# Patient Record
Sex: Male | Born: 1960 | Race: White | Hispanic: No | Marital: Single | Smoking: Never smoker
Health system: Southern US, Community
[De-identification: ages and names within clinical notes are randomized; demographics above are authoritative.]

## PROBLEM LIST (undated history)

## (undated) DIAGNOSIS — E119 Type 2 diabetes mellitus without complications: Secondary | ICD-10-CM

---

## 2001-05-26 ENCOUNTER — Ambulatory Visit (HOSPITAL_BASED_OUTPATIENT_CLINIC_OR_DEPARTMENT_OTHER): Admission: RE | Admit: 2001-05-26 | Discharge: 2001-05-26 | Payer: Self-pay | Admitting: Orthopedic Surgery

## 2001-08-27 ENCOUNTER — Ambulatory Visit (HOSPITAL_BASED_OUTPATIENT_CLINIC_OR_DEPARTMENT_OTHER): Admission: RE | Admit: 2001-08-27 | Discharge: 2001-08-27 | Payer: Self-pay | Admitting: Orthopedic Surgery

## 2002-02-23 ENCOUNTER — Ambulatory Visit (HOSPITAL_BASED_OUTPATIENT_CLINIC_OR_DEPARTMENT_OTHER): Admission: RE | Admit: 2002-02-23 | Discharge: 2002-02-24 | Payer: Self-pay | Admitting: Orthopedic Surgery

## 2002-09-22 ENCOUNTER — Ambulatory Visit (HOSPITAL_BASED_OUTPATIENT_CLINIC_OR_DEPARTMENT_OTHER): Admission: RE | Admit: 2002-09-22 | Discharge: 2002-09-22 | Payer: Self-pay | Admitting: Orthopedic Surgery

## 2003-03-29 ENCOUNTER — Ambulatory Visit (HOSPITAL_COMMUNITY): Admission: RE | Admit: 2003-03-29 | Discharge: 2003-03-29 | Payer: Self-pay | Admitting: Orthopedic Surgery

## 2003-03-29 ENCOUNTER — Ambulatory Visit (HOSPITAL_BASED_OUTPATIENT_CLINIC_OR_DEPARTMENT_OTHER): Admission: RE | Admit: 2003-03-29 | Discharge: 2003-03-30 | Payer: Self-pay | Admitting: Orthopedic Surgery

## 2013-10-15 ENCOUNTER — Emergency Department (HOSPITAL_COMMUNITY)
Admission: EM | Admit: 2013-10-15 | Discharge: 2013-10-15 | Disposition: A | Discharge status: Home / Self Care | Payer: Self-pay | Attending: Emergency Medicine | Admitting: Emergency Medicine

## 2013-10-15 DIAGNOSIS — T4271XA Poisoning by unspecified antiepileptic and sedative-hypnotic drugs, accidental (unintentional), initial encounter: Secondary | ICD-10-CM | POA: Insufficient documentation

## 2013-10-15 DIAGNOSIS — R4182 Altered mental status, unspecified: Secondary | ICD-10-CM | POA: Insufficient documentation

## 2013-10-15 DIAGNOSIS — Z79899 Other long term (current) drug therapy: Secondary | ICD-10-CM | POA: Insufficient documentation

## 2013-10-15 DIAGNOSIS — Z880 Allergy status to penicillin: Secondary | ICD-10-CM | POA: Insufficient documentation

## 2013-10-15 DIAGNOSIS — F1010 Alcohol abuse, uncomplicated: Secondary | ICD-10-CM | POA: Insufficient documentation

## 2013-10-15 DIAGNOSIS — Y939 Activity, unspecified: Secondary | ICD-10-CM | POA: Insufficient documentation

## 2013-10-15 DIAGNOSIS — T40601A Poisoning by unspecified narcotics, accidental (unintentional), initial encounter: Secondary | ICD-10-CM

## 2013-10-15 DIAGNOSIS — Y929 Unspecified place or not applicable: Secondary | ICD-10-CM | POA: Insufficient documentation

## 2013-10-15 NOTE — ED Notes (Signed)
Patient admits to drinking Vodka and chewing fentanyl patches.
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
## 2013-10-15 NOTE — ED Notes (Signed)
MD, Resident at bedside.

## 2013-10-15 NOTE — Discharge Instructions (Signed)
Overdose, Adult  A person can overdose on alcohol, drugs or both by accident or on purpose. If it was on purpose, it is a serious matter. Professional help should be sought. If the overdose was an accident, certain steps should be taken to make sure that it never happens again.  ACCIDENTAL OVERDOSE  Overdosing on prescription medications can be a result of:  · Not understanding the instructions.  · Misreading the label.  · Forgetting that you took a dose and then taking another by mistake. This situation happens a lot.  To make sure this does not happen again:  · Clarify the correct dosage with your caregiver.  · Place the correct dosage in a "pill-minder" container (labeled for each day and time of day).  · Have someone dispense your medicine.  Please be sure to follow-up with your primary care doctor as directed.  INTENTIONAL OVERDOSE  If the overdose was on purpose, it is a serious situation. Taking more than the prescribed amount of medications (including taking someone else's prescription), abusing street drugs or drinking an amount of alcohol that requires medical treatment can show a variety of possible problems. These may indicate you:  · Are depressed or suicidal.  · Are abusing drugs, took too much or combined different drugs to experiment with the effects.  · Mixed alcohol with drugs and did not realize the danger of doing so (this is drug abuse).  · Are suffering addiction to drugs and/or alcohol (also known as chemical dependency).  · Binge drink.  If you have not been referred to a mental health professional for help, it is important that you get help right away. Only a professional can determine which problems may exist and what the best course of treatment may be. It is your responsibility to follow-up with further evaluation or treatment as directed.   Alcohol is responsible for a large number of overdoses and unintended deaths among college-age young adults. Binge drinking is consuming 4-5 drinks  in a short period of time. The amount of alcohol in standard servings of wine (5 oz.), beer (12 oz.) and distilled spirits (1.5 oz., 80 proof) is the same. Beer or wine can be just as dangerous to the binge drinker as "hard" liquor can be.   CONSEQUENCES OF BINGE DRINKING  Alcohol poisoning is the most serious consequence of binge drinking. This is a severe and potentially fatal physical reaction to an alcohol overdose. When too much alcohol is consumed, the brain does not get enough oxygen. The lack of oxygen will eventually cause the brain to shut down the voluntary functions that regulate breathing and heart rate. Symptoms of alcohol poisoning include:  · Vomiting.  · Passing out (unconsciousness).  · Cold, clammy, pale or bluish skin.  · Slow or irregular breathing.  WHAT SHOULD I DO NEXT?  If you have a history of drug abuse or suffer chemical dependency (alcoholism, drug addiction or both), you might consider the following:  · Talk with a qualified substance abuse counselor and consider entering a treatment program.  · Go to a detox facility if necessary.  · If you were attending self-help group meetings, consider returning to them and go often.  · Explore other resources located near you (see sources listed below).  If you are unsure if you have a substance abuse problem, ask yourself the following questions:  · Have you been told by friends or family that drugs/alcohol has become a problem?  · Do you get into fights   when drinking or using drugs?  · Do you have blackouts (not remembering what you do while using)?  · Do you lie about use or amounts of drugs or alcohol you consume?  · Do you need chemicals to get you going?  · Do you suffer in work or school performance because of drug or alcohol use?  · Do you get sick from drug or alcohol use but continue to use anyway?  · Do you need drugs or alcohol to relate to people or feel comfortable in social situations?  · Do you use drugs or alcohol to forget  problems?  If you answered "Yes" to any of the above questions, it means you show signs of chemical dependency and a professional evaluation is suggested. The longer the use of drugs and alcohol continues, the problems will become greater.  SEEK IMMEDIATE MEDICAL CARE IF:   · You feel like you might repeat your problematic behavior.  · You need someone to talk to and feel that it should not wait.  · You feel you are a danger to yourself or someone else.  · You feel like you are having a new reaction to medications you are taking, or you are getting worse after leaving a care center.  · You have an overwhelming urge to drink or use drugs.  Addiction cannot be cured, but it can be treated successfully. Treatment centers are listed in the yellow pages under: Cocaine, Narcotics, and Alcoholics Anonymous. Most hospitals and clinics can refer you to a specialized care center. The [place] government maintains a toll-free number for obtaining treatment referrals: 1-800-662-4357 or 1-800-487-4889 (TDD) and maintains a website: http://findtreatment.samhsa.gov. Other websites for additional information are: www.mentalhealth.samhsa.gov. and www.[place]ida.gov.  In [place] treatment resources are listed in each Province with listings available under [facility] [employer] [facility] or similar titles.  Document Released: 06/06/2003 Document Revised: 08/26/2011 Document Reviewed: 04/27/2008  [employer]® Patient Information ©2014 [employer].

## 2013-10-15 NOTE — ED Notes (Signed)
Family at bedside. 

## 2013-10-15 NOTE — ED Notes (Signed)
Valuables were given back to pt

## 2013-10-15 NOTE — ED Notes (Signed)
Received from EMS $100 dollar bills x 3, $20 x2, 25 cents x 2 and 10 cents x2. Patients pants, shoes, keys, wallet.

## 2013-10-15 NOTE — ED Notes (Signed)
Patient given paper scrubs to go home in.

## 2013-10-16 NOTE — ED Provider Notes (Signed)
.  Face to face Exam:  General: Lethargic with slurring fo words HEENT:  Atraumatic Resp:  Normal effort Abd:  Nondistended Neuro:No focal deficits    Roberto Shiobert L Doak Mah, MD 10/16/13 1056

## 2020-03-15 ENCOUNTER — Inpatient Hospital Stay (HOSPITAL_COMMUNITY)
Admission: EM | Admit: 2020-03-15 | Discharge: 2020-04-17 | DRG: 871 | Disposition: E | Payer: Self-pay | Attending: Internal Medicine | Admitting: Internal Medicine

## 2020-03-15 ENCOUNTER — Emergency Department (HOSPITAL_COMMUNITY): Payer: Self-pay

## 2020-03-15 ENCOUNTER — Other Ambulatory Visit: Payer: Self-pay

## 2020-03-15 ENCOUNTER — Encounter (HOSPITAL_COMMUNITY): Payer: Self-pay

## 2020-03-15 DIAGNOSIS — Z9114 Patient's other noncompliance with medication regimen: Secondary | ICD-10-CM

## 2020-03-15 DIAGNOSIS — Z87891 Personal history of nicotine dependence: Secondary | ICD-10-CM

## 2020-03-15 DIAGNOSIS — E871 Hypo-osmolality and hyponatremia: Secondary | ICD-10-CM | POA: Diagnosis not present

## 2020-03-15 DIAGNOSIS — E870 Hyperosmolality and hypernatremia: Secondary | ICD-10-CM

## 2020-03-15 DIAGNOSIS — D509 Iron deficiency anemia, unspecified: Secondary | ICD-10-CM | POA: Diagnosis present

## 2020-03-15 DIAGNOSIS — Z6822 Body mass index (BMI) 22.0-22.9, adult: Secondary | ICD-10-CM

## 2020-03-15 DIAGNOSIS — Z79899 Other long term (current) drug therapy: Secondary | ICD-10-CM

## 2020-03-15 DIAGNOSIS — E1100 Type 2 diabetes mellitus with hyperosmolarity without nonketotic hyperglycemic-hyperosmolar coma (NKHHC): Secondary | ICD-10-CM | POA: Diagnosis present

## 2020-03-15 DIAGNOSIS — N179 Acute kidney failure, unspecified: Secondary | ICD-10-CM | POA: Diagnosis present

## 2020-03-15 DIAGNOSIS — IMO0002 Reserved for concepts with insufficient information to code with codable children: Secondary | ICD-10-CM | POA: Diagnosis present

## 2020-03-15 DIAGNOSIS — E1140 Type 2 diabetes mellitus with diabetic neuropathy, unspecified: Secondary | ICD-10-CM | POA: Diagnosis present

## 2020-03-15 DIAGNOSIS — E1110 Type 2 diabetes mellitus with ketoacidosis without coma: Principal | ICD-10-CM

## 2020-03-15 DIAGNOSIS — E1165 Type 2 diabetes mellitus with hyperglycemia: Secondary | ICD-10-CM | POA: Diagnosis present

## 2020-03-15 DIAGNOSIS — Z66 Do not resuscitate: Secondary | ICD-10-CM | POA: Diagnosis not present

## 2020-03-15 DIAGNOSIS — E440 Moderate protein-calorie malnutrition: Secondary | ICD-10-CM | POA: Diagnosis present

## 2020-03-15 DIAGNOSIS — I10 Essential (primary) hypertension: Secondary | ICD-10-CM | POA: Diagnosis present

## 2020-03-15 DIAGNOSIS — F1910 Other psychoactive substance abuse, uncomplicated: Secondary | ICD-10-CM | POA: Diagnosis present

## 2020-03-15 DIAGNOSIS — E876 Hypokalemia: Secondary | ICD-10-CM | POA: Diagnosis present

## 2020-03-15 DIAGNOSIS — K219 Gastro-esophageal reflux disease without esophagitis: Secondary | ICD-10-CM | POA: Diagnosis present

## 2020-03-15 DIAGNOSIS — J988 Other specified respiratory disorders: Secondary | ICD-10-CM

## 2020-03-15 DIAGNOSIS — E11649 Type 2 diabetes mellitus with hypoglycemia without coma: Secondary | ICD-10-CM | POA: Diagnosis not present

## 2020-03-15 DIAGNOSIS — E872 Acidosis, unspecified: Secondary | ICD-10-CM

## 2020-03-15 DIAGNOSIS — J9601 Acute respiratory failure with hypoxia: Secondary | ICD-10-CM | POA: Diagnosis not present

## 2020-03-15 DIAGNOSIS — Z20822 Contact with and (suspected) exposure to covid-19: Secondary | ICD-10-CM | POA: Diagnosis present

## 2020-03-15 DIAGNOSIS — J90 Pleural effusion, not elsewhere classified: Secondary | ICD-10-CM | POA: Diagnosis present

## 2020-03-15 DIAGNOSIS — A419 Sepsis, unspecified organism: Principal | ICD-10-CM | POA: Diagnosis present

## 2020-03-15 DIAGNOSIS — D539 Nutritional anemia, unspecified: Secondary | ICD-10-CM | POA: Diagnosis present

## 2020-03-15 DIAGNOSIS — R0602 Shortness of breath: Secondary | ICD-10-CM

## 2020-03-15 DIAGNOSIS — J189 Pneumonia, unspecified organism: Secondary | ICD-10-CM

## 2020-03-15 DIAGNOSIS — Z515 Encounter for palliative care: Secondary | ICD-10-CM

## 2020-03-15 DIAGNOSIS — E860 Dehydration: Secondary | ICD-10-CM

## 2020-03-15 DIAGNOSIS — T17908A Unspecified foreign body in respiratory tract, part unspecified causing other injury, initial encounter: Secondary | ICD-10-CM

## 2020-03-15 DIAGNOSIS — K529 Noninfective gastroenteritis and colitis, unspecified: Secondary | ICD-10-CM | POA: Diagnosis present

## 2020-03-15 DIAGNOSIS — F1020 Alcohol dependence, uncomplicated: Secondary | ICD-10-CM | POA: Diagnosis present

## 2020-03-15 DIAGNOSIS — Z7984 Long term (current) use of oral hypoglycemic drugs: Secondary | ICD-10-CM

## 2020-03-15 DIAGNOSIS — R6521 Severe sepsis with septic shock: Secondary | ICD-10-CM | POA: Diagnosis present

## 2020-03-15 DIAGNOSIS — D649 Anemia, unspecified: Secondary | ICD-10-CM

## 2020-03-15 HISTORY — DX: Type 2 diabetes mellitus without complications: E11.9

## 2020-03-15 LAB — RESPIRATORY PANEL BY RT PCR (FLU A&B, COVID)
Influenza A by PCR: NEGATIVE
Influenza B by PCR: NEGATIVE
SARS Coronavirus 2 by RT PCR: NEGATIVE

## 2020-03-15 LAB — CBC WITH DIFFERENTIAL/PLATELET
Abs Immature Granulocytes: 0.23 10*3/uL — ABNORMAL HIGH (ref 0.00–0.07)
Basophils Absolute: 0.1 10*3/uL (ref 0.0–0.1)
Basophils Relative: 1 %
Eosinophils Absolute: 0 10*3/uL (ref 0.0–0.5)
Eosinophils Relative: 0 %
HCT: 21.6 % — ABNORMAL LOW (ref 39.0–52.0)
Hemoglobin: 7 g/dL — ABNORMAL LOW (ref 13.0–17.0)
Immature Granulocytes: 1 %
Lymphocytes Relative: 21 %
Lymphs Abs: 3.4 10*3/uL (ref 0.7–4.0)
MCH: 34.3 pg — ABNORMAL HIGH (ref 26.0–34.0)
MCHC: 32.4 g/dL (ref 30.0–36.0)
MCV: 105.9 fL — ABNORMAL HIGH (ref 80.0–100.0)
Monocytes Absolute: 0.7 10*3/uL (ref 0.1–1.0)
Monocytes Relative: 4 %
Neutro Abs: 12.1 10*3/uL — ABNORMAL HIGH (ref 1.7–7.7)
Neutrophils Relative %: 73 %
Platelets: 171 10*3/uL (ref 150–400)
RBC: 2.04 MIL/uL — ABNORMAL LOW (ref 4.22–5.81)
RDW: 15.7 % — ABNORMAL HIGH (ref 11.5–15.5)
WBC: 16.6 10*3/uL — ABNORMAL HIGH (ref 4.0–10.5)
nRBC: 0.7 % — ABNORMAL HIGH (ref 0.0–0.2)

## 2020-03-15 LAB — BLOOD GAS, VENOUS
Acid-Base Excess: 0.3 mmol/L (ref 0.0–2.0)
Bicarbonate: 24 mmol/L (ref 20.0–28.0)
O2 Saturation: 86.2 %
Patient temperature: 98.6
pCO2, Ven: 37.3 mmHg — ABNORMAL LOW (ref 44.0–60.0)
pH, Ven: 7.425 (ref 7.250–7.430)
pO2, Ven: 56.6 mmHg — ABNORMAL HIGH (ref 32.0–45.0)

## 2020-03-15 LAB — RETICULOCYTES
Immature Retic Fract: 8.5 % (ref 2.3–15.9)
RBC.: 2.03 MIL/uL — ABNORMAL LOW (ref 4.22–5.81)
Retic Count, Absolute: 30.7 10*3/uL (ref 19.0–186.0)
Retic Ct Pct: 1.5 % (ref 0.4–3.1)

## 2020-03-15 LAB — CBG MONITORING, ED
Glucose-Capillary: >600 mg/dL (ref 70–99)
Glucose-Capillary: >600 mg/dL (ref 70–99)
Glucose-Capillary: >600 mg/dL (ref 70–99)
Glucose-Capillary: >600 mg/dL (ref 70–99)

## 2020-03-15 LAB — ETHANOL: Alcohol, Ethyl (B): <10 mg/dL (ref <10)

## 2020-03-15 LAB — BETA-HYDROXYBUTYRIC ACID: Beta-Hydroxybutyric Acid: 0.63 mmol/L — ABNORMAL HIGH (ref 0.05–0.27)

## 2020-03-15 LAB — COMPREHENSIVE METABOLIC PANEL
ALT: 60 U/L — ABNORMAL HIGH (ref 0–44)
AST: 104 U/L — ABNORMAL HIGH (ref 15–41)
Albumin: 2.3 g/dL — ABNORMAL LOW (ref 3.5–5.0)
Alkaline Phosphatase: 204 U/L — ABNORMAL HIGH (ref 38–126)
Anion gap: 20 — ABNORMAL HIGH (ref 5–15)
BUN: 32 mg/dL — ABNORMAL HIGH (ref 6–20)
CO2: 21 mmol/L — ABNORMAL LOW (ref 22–32)
Calcium: 8.3 mg/dL — ABNORMAL LOW (ref 8.9–10.3)
Chloride: 96 mmol/L — ABNORMAL LOW (ref 98–111)
Creatinine, Ser: 1.75 mg/dL — ABNORMAL HIGH (ref 0.61–1.24)
GFR calc Af Amer: 48 mL/min — ABNORMAL LOW (ref >60)
GFR calc non Af Amer: 41 mL/min — ABNORMAL LOW (ref >60)
Glucose, Bld: 1171 mg/dL (ref 70–99)
Potassium: 2.9 mmol/L — ABNORMAL LOW (ref 3.5–5.1)
Sodium: 137 mmol/L (ref 135–145)
Total Bilirubin: 1.7 mg/dL — ABNORMAL HIGH (ref 0.3–1.2)
Total Protein: 7.2 g/dL (ref 6.5–8.1)

## 2020-03-15 LAB — TYPE AND SCREEN
ABO/RH(D): A POS
Antibody Screen: NEGATIVE

## 2020-03-15 LAB — PROTIME-INR
INR: 1.4 — ABNORMAL HIGH (ref 0.8–1.2)
Prothrombin Time: 16.9 seconds — ABNORMAL HIGH (ref 11.4–15.2)

## 2020-03-15 LAB — POC OCCULT BLOOD, ED: Fecal Occult Bld: POSITIVE — AB

## 2020-03-15 LAB — LACTIC ACID, PLASMA: Lactic Acid, Venous: 6.9 mmol/L (ref 0.5–1.9)

## 2020-03-15 LAB — AMMONIA: Ammonia: 45 umol/L — ABNORMAL HIGH (ref 9–35)

## 2020-03-15 MED ORDER — LACTATED RINGERS IV BOLUS
20.0000 mL/kg | Freq: Once | INTRAVENOUS | Status: AC
  Administered 2020-03-15: 1000 mL via INTRAVENOUS

## 2020-03-15 MED ORDER — DEXTROSE 50 % IV SOLN: 0.0000 mL | INTRAVENOUS | Status: DC | PRN

## 2020-03-15 MED ORDER — DEXTROSE IN LACTATED RINGERS 5 % IV SOLN: INTRAVENOUS | Status: DC

## 2020-03-15 MED ORDER — SODIUM CHLORIDE 0.9 % IV SOLN
2.0000 g | Freq: Once | INTRAVENOUS | Status: AC
  Administered 2020-03-15: 2 g via INTRAVENOUS
  Filled 2020-03-15: qty 2

## 2020-03-15 MED ORDER — VANCOMYCIN HCL 2000 MG/400ML IV SOLN
2000.0000 mg | Freq: Once | INTRAVENOUS | Status: AC
  Administered 2020-03-15: 2000 mg via INTRAVENOUS
  Filled 2020-03-15: qty 400

## 2020-03-15 MED ORDER — INSULIN REGULAR(HUMAN) IN NACL 100-0.9 UT/100ML-% IV SOLN
INTRAVENOUS | Status: DC
  Administered 2020-03-15: 6 [IU]/h via INTRAVENOUS
  Filled 2020-03-15: qty 100

## 2020-03-15 MED ORDER — SODIUM CHLORIDE 0.9 % IV BOLUS
1000.0000 mL | Freq: Once | INTRAVENOUS | Status: AC
  Administered 2020-03-15: 1000 mL via INTRAVENOUS

## 2020-03-15 MED ORDER — POTASSIUM CHLORIDE 10 MEQ/100ML IV SOLN
10.0000 meq | INTRAVENOUS | Status: AC
  Administered 2020-03-15 – 2020-03-16 (×4): 10 meq via INTRAVENOUS
  Filled 2020-03-15 (×4): qty 100

## 2020-03-15 MED ORDER — LACTATED RINGERS IV BOLUS
1000.0000 mL | Freq: Once | INTRAVENOUS | Status: AC
  Administered 2020-03-15: 1000 mL via INTRAVENOUS

## 2020-03-15 MED ORDER — LACTATED RINGERS IV SOLN: INTRAVENOUS | Status: DC

## 2020-03-15 NOTE — Progress Notes (Signed)
A consult was received from an ED physician for Vancomycin & Cefepime per pharmacy dosing.  The patient's profile has been reviewed for ht/wt/allergies/indication/available labs.   A one time order has been placed for Cefepime 2gm & Vancomycin 2gm.  Further antibiotics/pharmacy consults should be ordered by admitting physician if indicated.                       Thank you, Junita Push PharmD 03/20/2020  10:02 PM

## 2020-03-15 NOTE — ED Provider Notes (Addendum)
[facility]-EMERGENCY DEPT Provider Note   CSN: [id number] Arrival date & time: 02/29/2020  1928     History Chief Complaint  Patient presents with  . Weakness  . hyperglycemic    Roberto Rhodes is a 59 y.o. male.  HPI      59 year old male with history of type 2 diabetes, neuropathy, hypertension, alcohol abuse,presents with concern for generalized weakness, diarrhea, hyperglycemia, cough.  History is limited by patient's illness-he is sleepy, slow to answer questions, and confused.  He does report feeling ill for the last few days. Reports body aches, sore throat, cough, diarrhea, nausea and abdominal pain. Reports fever when asked, not sure how high. Denies vomiting.  Reports his sugars have been running high.  Reports that he does drink alcohol. Denies other drug use. Denies black or bloody stools   Had reported to triage that he has been feeling generally weak for the last 3 or 4 days and had intermittent diarrhea for 3 weeks after eating barbecue.  He has been vaccinated against COVID-19.  No known sick contacts.  Past Medical History:  Diagnosis Date  . Diabetes mellitus without complication (HCC)     There are no problems to display for this patient.      History reviewed. No pertinent family history.  Social History   Tobacco Use  . Smoking status: Never Smoker  . Smokeless tobacco: Never Used  Substance Use Topics  . Alcohol use: Never  . Drug use: Never    Home Medications Prior to Admission medications   Medication Sig Start Date End Date Taking? Authorizing Provider  acetaminophen (TYLENOL) 500 MG tablet Take 1,000 mg by mouth every 6 (six) hours as needed (pain).    [provider]  busPIRone (BUSPAR) 5 MG tablet Take 5 mg by mouth 2 (two) times daily. 11/11/19   [provider]  gabapentin (NEURONTIN) 300 MG capsule Take 300 mg by mouth 3 (three) times daily.    [provider]  glipiZIDE (GLUCOTROL) 5  MG tablet Take 5 mg by mouth daily before breakfast.    [provider]  hydrochlorothiazide (MICROZIDE) 12.5 MG capsule Take 12.5 mg by mouth daily.    [provider]  ibuprofen (ADVIL,MOTRIN) 800 MG tablet Take 800 mg by mouth every 8 (eight) hours as needed.    [provider]  lisinopril (PRINIVIL,ZESTRIL) 20 MG tablet Take 20 mg by mouth 2 (two) times daily.    [provider]  metFORMIN (GLUCOPHAGE) 850 MG tablet Take 850 mg by mouth 2 (two) times daily with a meal.    [provider]  metFORMIN (GLUCOPHAGE-XR) 750 MG 24 hr tablet Take 750 mg by mouth daily. 11/11/19   [provider]  omeprazole (PRILOSEC) 40 MG capsule Take 40 mg by mouth daily.    [provider]  propranolol (INDERAL) 20 MG tablet Take 20 mg by mouth 3 (three) times daily. 10/06/19   [provider]    Allergies    Penicillin g and Penicillins  Review of Systems   Review of Systems  Constitutional: Positive for appetite change, fatigue and fever.  Respiratory: Positive for cough.   Cardiovascular: Negative for chest pain.  Gastrointestinal: Positive for abdominal pain, diarrhea and nausea. Negative for blood in stool and vomiting.  Genitourinary: Negative for dysuria.  Musculoskeletal: Positive for myalgias.  Skin: Negative for rash.  Neurological: Positive for headaches.    Physical Exam Updated Vital Signs BP 99/60   Pulse 83  Temp (!) 97.5 F (36.4 C) (Oral)   Resp (!) 22   SpO2 96%   Physical Exam Vitals and nursing note reviewed.  Constitutional:      General: He is not in acute distress.    Appearance: He is well-developed. He is ill-appearing and toxic-appearing. He is not diaphoretic.  HENT:     Head: Normocephalic and atraumatic.  Eyes:     Conjunctiva/sclera: Conjunctivae normal.  Cardiovascular:     Rate and Rhythm: Normal rate and regular rhythm.     Heart sounds: Normal heart sounds. No murmur heard.  No  friction rub. No gallop.   Pulmonary:     Effort: Pulmonary effort is normal. No respiratory distress.     Breath sounds: Normal breath sounds. No wheezing or rales.  Abdominal:     General: There is no distension.     Palpations: Abdomen is soft.     Tenderness: There is no abdominal tenderness. There is no guarding.     Comments: Hepatomegaly  Musculoskeletal:     Cervical back: Normal range of motion.  Skin:    General: Skin is warm and dry.  Neurological:     Comments: Oriented to self, location, when asked date states Thursday then states August no--April Sleepy, slow to respond, can answer yes/no questions     ED Results / Procedures / Treatments   Labs (all labs ordered are listed, but only abnormal results are displayed) Labs Reviewed  COMPREHENSIVE METABOLIC PANEL - Abnormal; Notable for the following components:      Result Value   Potassium 2.9 (*)    Chloride 96 (*)    CO2 21 (*)    Glucose, Bld 1,171 (*)    BUN 32 (*)    Creatinine, Ser 1.75 (*)    Calcium 8.3 (*)    Albumin 2.3 (*)    AST 104 (*)    ALT 60 (*)    Alkaline Phosphatase 204 (*)    Total Bilirubin 1.7 (*)    GFR calc non Af Amer 41 (*)    GFR calc Af Amer 48 (*)    Anion gap 20 (*)    All other components within normal limits  LACTIC ACID, PLASMA - Abnormal; Notable for the following components:   Lactic Acid, Venous 6.9 (*)    All other components within normal limits  CBC WITH DIFFERENTIAL/PLATELET - Abnormal; Notable for the following components:   WBC 16.6 (*)    RBC 2.04 (*)    Hemoglobin 7.0 (*)    HCT 21.6 (*)    MCV 105.9 (*)    MCH 34.3 (*)    RDW 15.7 (*)    nRBC 0.7 (*)    Neutro Abs 12.1 (*)    Abs Immature Granulocytes 0.23 (*)    All other components within normal limits  PROTIME-INR - Abnormal; Notable for the following components:   Prothrombin Time 16.9 (*)    INR 1.4 (*)    All other components within normal limits  BETA-HYDROXYBUTYRIC ACID - Abnormal; Notable  for the following components:   Beta-Hydroxybutyric Acid 0.63 (*)    All other components within normal limits  BLOOD GAS, VENOUS - Abnormal; Notable for the following components:   pCO2, Ven 37.3 (*)    pO2, Ven 56.6 (*)    All other components within normal limits  RETICULOCYTES - Abnormal; Notable for the following components:   RBC. 2.03 (*)    All other components within normal limits  CBG MONITORING, ED - Abnormal; Notable for the following components:   Glucose-Capillary >600 (*)    All other components within normal limits  CBG MONITORING, ED - Abnormal; Notable for the following components:   Glucose-Capillary >600 (*)    All other components within normal limits  POC OCCULT BLOOD, ED - Abnormal; Notable for the following components:   Fecal Occult Bld POSITIVE (*)    All other components within normal limits  RESPIRATORY PANEL BY RT PCR (FLU A&B, COVID)  CULTURE, BLOOD (ROUTINE X 2)  CULTURE, BLOOD (ROUTINE X 2)  URINALYSIS, ROUTINE W REFLEX MICROSCOPIC  BASIC METABOLIC PANEL  BASIC METABOLIC PANEL  BASIC METABOLIC PANEL  BETA-HYDROXYBUTYRIC ACID  CBC WITH DIFFERENTIAL/PLATELET  ETHANOL  VITAMIN B12  FOLATE  IRON AND TIBC  FERRITIN  AMMONIA  BASIC METABOLIC PANEL  I-STAT CHEM 8, ED  TYPE AND SCREEN    EKG EKG Interpretation  Date/Time:  Wednesday March 15 2020 20:55:30 EDT Ventricular Rate:  90 PR Interval:    QRS Duration: 68 QT Interval:  403 QTC Calculation: 494 R Axis:   13 Text Interpretation: Sinus rhythm Low voltage, precordial leads Abnormal R-wave progression, early transition Repol abnrm, severe global ischemia (LM/MVD) Baseline wander in lead(s) I II aVR V1 No significant change since last tracing Confirmed by Alvira Monday ([id number]) on 03/09/2020 10:15:12 PM   Radiology DG Chest Port 1 View  Result Date: 02/27/2020 CLINICAL DATA:  Generalized weakness. EXAM: PORTABLE CHEST 1 VIEW COMPARISON:  June 27, 2019 FINDINGS: There is mild  right infrahilar atelectasis and/or infiltrate. There is no evidence of a pleural effusion or pneumothorax. The heart size and mediastinal contours are within normal limits. A chronic fracture deformity of the proximal right humeral shaft is seen. IMPRESSION: Mild right infrahilar atelectasis and/or infiltrate. Electronically Signed   By: Aram Candela M.D.   On: 02/27/2020 21:47    Procedures .Critical Care Performed by: Alvira Monday, MD Authorized by: Alvira Monday, MD   Critical care provider statement:    Critical care time (minutes):  90   Critical care was necessary to treat or prevent imminent or life-threatening deterioration of the following conditions:  Dehydration and endocrine crisis   Critical care was time spent personally by me on the following activities:  Discussions with consultants, evaluation of patient's response to treatment, examination of patient, ordering and performing treatments and interventions, ordering and review of laboratory studies, ordering and review of radiographic studies, pulse oximetry, re-evaluation of patient's condition, obtaining history from patient or surrogate and review of old charts   (including critical care time)  Medications Ordered in ED Medications  insulin regular, human (MYXREDLIN) 100 units/ 100 mL infusion (6 Units/hr Intravenous New Bag/Given 03/09/2020 2139)  lactated ringers infusion (has no administration in time range)  dextrose 5 % in lactated ringers infusion (0 mLs Intravenous Hold 03/16/2020 2216)  dextrose 50 % solution 0-50 mL (has no administration in time range)  potassium chloride 10 mEq in 100 mL IVPB (10 mEq Intravenous New Bag/Given (Non-Interop) 03/06/2020 2250)  vancomycin (VANCOREADY) IVPB 2000 mg/400 mL (has no administration in time range)  sodium chloride 0.9 % bolus 1,000 mL (1,000 mLs Intravenous New Bag/Given 03/07/2020 2055)  lactated ringers bolus 20 mL/kg (1,000 mLs Intravenous New Bag/Given 03/13/2020 2129)    lactated ringers bolus 1,000 mL (1,000 mLs Intravenous New Bag/Given (Non-Interop) 02/28/2020 2232)  ceFEPIme (MAXIPIME) 2 g in sodium chloride 0.9 % 100 mL IVPB (0 g Intravenous Stopped 02/29/2020 2302)  ED Course  I have reviewed the triage vital signs and the nursing notes.  Pertinent labs & imaging results that were available during my care of the patient were reviewed by me and considered in my medical decision making (see chart for details).    MDM Rules/Calculators/A&P                          59 year old male with history of type 2 diabetes, neuropathy, hypertension, alcohol abuse,presents with concern for generalized weakness, diarrhea, hyperglycemia, cough.  Arrives with temperature 97.5, normal heart rate, blood pressure 116/76, and oxygen saturation down to 85% on room air.  Oxygen saturation improved with nasal cannula.  Blood pressures decreased to 90 systolic after arrival.    Labs significant for hypokalemia, anion gap with mild acidosis with a bicarb of 21, glucose of 1171 with corrected sodium of 163, creatinine of 1.75, elevated beta hydroxybutyrate. Hgb 7, leukocytosis 16.6. Denies black or bloody stools.   Do not feel presentation consistent with acute GI bleed with this time--no melena on exam although he is hemoccult positive.   Chest x-ray shows mild right infrahilar atelectasis and/or infiltrate.  Given blood pressures, leukocytosis will empirically cover with vancomycin and cefepime for possible pneumonia.  Code sepsis not initiated given concern for likely HHS/DKA dehydration, possible COVID 19.  Given insulin gtt, fluids--3L (NS and LR), potassium, abx.      Final Clinical Impression(s) / ED Diagnoses Final diagnoses:  Diabetic ketoacidosis without coma associated with type 2 diabetes mellitus (HCC)  Anemia, unspecified type  Hypernatremia  Healthcare-associated pneumonia  Dehydration    Rx / DC Orders ED Discharge Orders    None       Alvira MondaySchlossman,  Nieko Clarin, MD 06/04/20 2322    Alvira MondaySchlossman, Greenley Martone, MD 06/04/20 2323

## 2020-03-15 NOTE — ED Triage Notes (Signed)
Pt complains of general weakness for three or four days, she's had intermittent diarrhea for three weeks after eating BBQ that he's allergic to [facility] smoke Pt's CBG was reading high for EMS

## 2020-03-16 ENCOUNTER — Inpatient Hospital Stay (HOSPITAL_COMMUNITY): Payer: Self-pay

## 2020-03-16 ENCOUNTER — Inpatient Hospital Stay: Payer: Self-pay

## 2020-03-16 DIAGNOSIS — R400 Somnolence: Secondary | ICD-10-CM

## 2020-03-16 DIAGNOSIS — I509 Heart failure, unspecified: Secondary | ICD-10-CM

## 2020-03-16 DIAGNOSIS — E1110 Type 2 diabetes mellitus with ketoacidosis without coma: Secondary | ICD-10-CM | POA: Diagnosis present

## 2020-03-16 LAB — BASIC METABOLIC PANEL
Anion gap: 12 (ref 5–15)
Anion gap: 12 (ref 5–15)
Anion gap: 12 (ref 5–15)
Anion gap: 14 (ref 5–15)
Anion gap: 17 — ABNORMAL HIGH (ref 5–15)
Anion gap: 21 — ABNORMAL HIGH (ref 5–15)
BUN: 16 mg/dL (ref 6–20)
BUN: 18 mg/dL (ref 6–20)
BUN: 21 mg/dL — ABNORMAL HIGH (ref 6–20)
BUN: 22 mg/dL — ABNORMAL HIGH (ref 6–20)
BUN: 25 mg/dL — ABNORMAL HIGH (ref 6–20)
BUN: 28 mg/dL — ABNORMAL HIGH (ref 6–20)
CO2: 20 mmol/L — ABNORMAL LOW (ref 22–32)
CO2: 21 mmol/L — ABNORMAL LOW (ref 22–32)
CO2: 21 mmol/L — ABNORMAL LOW (ref 22–32)
CO2: 22 mmol/L (ref 22–32)
CO2: 23 mmol/L (ref 22–32)
CO2: 23 mmol/L (ref 22–32)
Calcium: 7.1 mg/dL — ABNORMAL LOW (ref 8.9–10.3)
Calcium: 7.6 mg/dL — ABNORMAL LOW (ref 8.9–10.3)
Calcium: 7.7 mg/dL — ABNORMAL LOW (ref 8.9–10.3)
Calcium: 7.9 mg/dL — ABNORMAL LOW (ref 8.9–10.3)
Calcium: 8.3 mg/dL — ABNORMAL LOW (ref 8.9–10.3)
Calcium: 8.4 mg/dL — ABNORMAL LOW (ref 8.9–10.3)
Chloride: 104 mmol/L (ref 98–111)
Chloride: 108 mmol/L (ref 98–111)
Chloride: 109 mmol/L (ref 98–111)
Chloride: 111 mmol/L (ref 98–111)
Chloride: 112 mmol/L — ABNORMAL HIGH (ref 98–111)
Chloride: 114 mmol/L — ABNORMAL HIGH (ref 98–111)
Creatinine, Ser: 0.97 mg/dL (ref 0.61–1.24)
Creatinine, Ser: 1.06 mg/dL (ref 0.61–1.24)
Creatinine, Ser: 1.11 mg/dL (ref 0.61–1.24)
Creatinine, Ser: 1.2 mg/dL (ref 0.61–1.24)
Creatinine, Ser: 1.37 mg/dL — ABNORMAL HIGH (ref 0.61–1.24)
Creatinine, Ser: 1.5 mg/dL — ABNORMAL HIGH (ref 0.61–1.24)
GFR calc Af Amer: 58 mL/min — ABNORMAL LOW (ref >60)
GFR calc Af Amer: >60 mL/min (ref >60)
GFR calc Af Amer: >60 mL/min (ref >60)
GFR calc Af Amer: >60 mL/min (ref >60)
GFR calc Af Amer: >60 mL/min (ref >60)
GFR calc Af Amer: >60 mL/min (ref >60)
GFR calc non Af Amer: 50 mL/min — ABNORMAL LOW (ref >60)
GFR calc non Af Amer: 56 mL/min — ABNORMAL LOW (ref >60)
GFR calc non Af Amer: >60 mL/min (ref >60)
GFR calc non Af Amer: >60 mL/min (ref >60)
GFR calc non Af Amer: >60 mL/min (ref >60)
GFR calc non Af Amer: >60 mL/min (ref >60)
Glucose, Bld: 115 mg/dL — ABNORMAL HIGH (ref 70–99)
Glucose, Bld: 131 mg/dL — ABNORMAL HIGH (ref 70–99)
Glucose, Bld: 148 mg/dL — ABNORMAL HIGH (ref 70–99)
Glucose, Bld: 193 mg/dL — ABNORMAL HIGH (ref 70–99)
Glucose, Bld: 364 mg/dL — ABNORMAL HIGH (ref 70–99)
Glucose, Bld: 904 mg/dL (ref 70–99)
Potassium: 2.1 mmol/L — CL (ref 3.5–5.1)
Potassium: 2.7 mmol/L — CL (ref 3.5–5.1)
Potassium: 3 mmol/L — ABNORMAL LOW (ref 3.5–5.1)
Potassium: 3.2 mmol/L — ABNORMAL LOW (ref 3.5–5.1)
Potassium: 4 mmol/L (ref 3.5–5.1)
Potassium: 4.8 mmol/L (ref 3.5–5.1)
Sodium: 137 mmol/L (ref 135–145)
Sodium: 146 mmol/L — ABNORMAL HIGH (ref 135–145)
Sodium: 147 mmol/L — ABNORMAL HIGH (ref 135–145)
Sodium: 147 mmol/L — ABNORMAL HIGH (ref 135–145)
Sodium: 149 mmol/L — ABNORMAL HIGH (ref 135–145)
Sodium: 150 mmol/L — ABNORMAL HIGH (ref 135–145)

## 2020-03-16 LAB — CBG MONITORING, ED
Glucose-Capillary: 108 mg/dL — ABNORMAL HIGH (ref 70–99)
Glucose-Capillary: 114 mg/dL — ABNORMAL HIGH (ref 70–99)
Glucose-Capillary: 126 mg/dL — ABNORMAL HIGH (ref 70–99)
Glucose-Capillary: 132 mg/dL — ABNORMAL HIGH (ref 70–99)
Glucose-Capillary: 137 mg/dL — ABNORMAL HIGH (ref 70–99)
Glucose-Capillary: 147 mg/dL — ABNORMAL HIGH (ref 70–99)
Glucose-Capillary: 154 mg/dL — ABNORMAL HIGH (ref 70–99)
Glucose-Capillary: 172 mg/dL — ABNORMAL HIGH (ref 70–99)
Glucose-Capillary: 191 mg/dL — ABNORMAL HIGH (ref 70–99)
Glucose-Capillary: 274 mg/dL — ABNORMAL HIGH (ref 70–99)
Glucose-Capillary: 327 mg/dL — ABNORMAL HIGH (ref 70–99)
Glucose-Capillary: 481 mg/dL — ABNORMAL HIGH (ref 70–99)
Glucose-Capillary: 530 mg/dL (ref 70–99)
Glucose-Capillary: 69 mg/dL — ABNORMAL LOW (ref 70–99)
Glucose-Capillary: 79 mg/dL (ref 70–99)
Glucose-Capillary: 86 mg/dL (ref 70–99)
Glucose-Capillary: 88 mg/dL (ref 70–99)

## 2020-03-16 LAB — URINALYSIS, ROUTINE W REFLEX MICROSCOPIC
Bacteria, UA: NONE SEEN
Bilirubin Urine: NEGATIVE
Glucose, UA: 150 mg/dL — AB
Ketones, ur: NEGATIVE mg/dL
Leukocytes,Ua: NEGATIVE
Nitrite: NEGATIVE
Protein, ur: NEGATIVE mg/dL
Specific Gravity, Urine: 1.038 — ABNORMAL HIGH (ref 1.005–1.030)
pH: 7 (ref 5.0–8.0)

## 2020-03-16 LAB — ABO/RH: ABO/RH(D): A POS

## 2020-03-16 LAB — BETA-HYDROXYBUTYRIC ACID
Beta-Hydroxybutyric Acid: 0.12 mmol/L (ref 0.05–0.27)
Beta-Hydroxybutyric Acid: 0.1 mmol/L (ref 0.05–0.27)
Beta-Hydroxybutyric Acid: 0.2 mmol/L (ref 0.05–0.27)
Beta-Hydroxybutyric Acid: 0.1 mmol/L (ref 0.05–0.27)
Beta-Hydroxybutyric Acid: 0.19 mmol/L (ref 0.05–0.27)

## 2020-03-16 LAB — CBC
HCT: 26 % — ABNORMAL LOW (ref 39.0–52.0)
Hemoglobin: 8.7 g/dL — ABNORMAL LOW (ref 13.0–17.0)
MCH: 33.9 pg (ref 26.0–34.0)
MCHC: 33.5 g/dL (ref 30.0–36.0)
MCV: 101.2 fL — ABNORMAL HIGH (ref 80.0–100.0)
Platelets: 120 10*3/uL — ABNORMAL LOW (ref 150–400)
RBC: 2.57 MIL/uL — ABNORMAL LOW (ref 4.22–5.81)
RDW: 15 % (ref 11.5–15.5)
WBC: 14.3 10*3/uL — ABNORMAL HIGH (ref 4.0–10.5)
nRBC: 0.8 % — ABNORMAL HIGH (ref 0.0–0.2)

## 2020-03-16 LAB — HIV ANTIBODY (ROUTINE TESTING W REFLEX): HIV Screen 4th Generation wRfx: NONREACTIVE

## 2020-03-16 LAB — IRON AND TIBC
Iron: 117 ug/dL (ref 45–182)
Saturation Ratios: 103 % — ABNORMAL HIGH (ref 17.9–39.5)
TIBC: 113 ug/dL — ABNORMAL LOW (ref 250–450)

## 2020-03-16 LAB — ECHOCARDIOGRAM COMPLETE
Area-P 1/2: 2.39 cm2
S' Lateral: 3.2 cm

## 2020-03-16 LAB — MRSA PCR SCREENING: MRSA by PCR: POSITIVE — AB

## 2020-03-16 LAB — VITAMIN B12: Vitamin B-12: 1040 pg/mL — ABNORMAL HIGH (ref 180–914)

## 2020-03-16 LAB — GLUCOSE, CAPILLARY: Glucose-Capillary: 137 mg/dL — ABNORMAL HIGH (ref 70–99)

## 2020-03-16 LAB — TROPONIN I (HIGH SENSITIVITY)
Troponin I (High Sensitivity): 11 ng/L (ref <18)
Troponin I (High Sensitivity): 12 ng/L (ref <18)

## 2020-03-16 LAB — FOLATE: Folate: >100 ng/mL (ref >5.9)

## 2020-03-16 LAB — FERRITIN: Ferritin: 1990 ng/mL — ABNORMAL HIGH (ref 24–336)

## 2020-03-16 MED ORDER — SODIUM CHLORIDE 0.9 % IV SOLN
500.0000 mg | INTRAVENOUS | Status: DC
  Administered 2020-03-16: 500 mg via INTRAVENOUS
  Filled 2020-03-16: qty 500

## 2020-03-16 MED ORDER — INSULIN ASPART 100 UNIT/ML ~~LOC~~ SOLN
4.0000 [IU] | Freq: Three times a day (TID) | SUBCUTANEOUS | Status: DC
  Administered 2020-03-17 – 2020-03-19 (×8): 4 [IU] via SUBCUTANEOUS
  Filled 2020-03-16: qty 0.04

## 2020-03-16 MED ORDER — POTASSIUM CHLORIDE 2 MEQ/ML IV SOLN
INTRAVENOUS | Status: DC
  Filled 2020-03-16 (×4): qty 1000

## 2020-03-16 MED ORDER — MUPIROCIN 2 % EX OINT
1.0000 "application " | TOPICAL_OINTMENT | Freq: Two times a day (BID) | CUTANEOUS | Status: AC
  Administered 2020-03-16 – 2020-03-20 (×10): 1 via NASAL
  Filled 2020-03-16 (×3): qty 22

## 2020-03-16 MED ORDER — DEXTROSE 50 % IV SOLN
0.0000 mL | INTRAVENOUS | Status: DC | PRN
  Administered 2020-03-19 – 2020-03-24 (×2): 50 mL via INTRAVENOUS
  Filled 2020-03-16 (×2): qty 50

## 2020-03-16 MED ORDER — INSULIN ASPART 100 UNIT/ML ~~LOC~~ SOLN
0.0000 [IU] | Freq: Three times a day (TID) | SUBCUTANEOUS | Status: DC
  Administered 2020-03-17: 2 [IU] via SUBCUTANEOUS
  Administered 2020-03-17: 1 [IU] via SUBCUTANEOUS
  Administered 2020-03-18 (×2): 2 [IU] via SUBCUTANEOUS
  Administered 2020-03-18: 5 [IU] via SUBCUTANEOUS
  Administered 2020-03-19 – 2020-03-20 (×3): 2 [IU] via SUBCUTANEOUS
  Administered 2020-03-21 (×2): 3 [IU] via SUBCUTANEOUS
  Administered 2020-03-22: 1 [IU] via SUBCUTANEOUS
  Administered 2020-03-23 (×2): 2 [IU] via SUBCUTANEOUS
  Filled 2020-03-16: qty 0.09

## 2020-03-16 MED ORDER — INSULIN ASPART 100 UNIT/ML ~~LOC~~ SOLN
0.0000 [IU] | Freq: Every day | SUBCUTANEOUS | Status: DC
  Filled 2020-03-16: qty 0.05

## 2020-03-16 MED ORDER — DEXTROSE IN LACTATED RINGERS 5 % IV SOLN: INTRAVENOUS | Status: DC

## 2020-03-16 MED ORDER — INSULIN DETEMIR 100 UNIT/ML ~~LOC~~ SOLN
0.3000 [IU]/kg | SUBCUTANEOUS | Status: DC
  Administered 2020-03-16 – 2020-03-19 (×4): 22 [IU] via SUBCUTANEOUS
  Filled 2020-03-16 (×6): qty 0.22

## 2020-03-16 MED ORDER — LACTATED RINGERS IV SOLN: INTRAVENOUS | Status: DC

## 2020-03-16 MED ORDER — IOHEXOL 300 MG/ML  SOLN
100.0000 mL | Freq: Once | INTRAMUSCULAR | Status: AC | PRN
  Administered 2020-03-16: 100 mL via INTRAVENOUS

## 2020-03-16 MED ORDER — THIAMINE HCL 100 MG/ML IJ SOLN
100.0000 mg | Freq: Every day | INTRAMUSCULAR | Status: DC
  Administered 2020-03-16 – 2020-03-17 (×2): 100 mg via INTRAVENOUS
  Filled 2020-03-16 (×2): qty 2

## 2020-03-16 MED ORDER — SODIUM CHLORIDE 0.9 % IV SOLN
1.0000 g | INTRAVENOUS | Status: DC
  Administered 2020-03-16: 1 g via INTRAVENOUS
  Filled 2020-03-16 (×2): qty 10

## 2020-03-16 MED ORDER — INSULIN REGULAR(HUMAN) IN NACL 100-0.9 UT/100ML-% IV SOLN: INTRAVENOUS | Status: DC

## 2020-03-16 MED ORDER — SODIUM CHLORIDE 0.9 % IV BOLUS
1000.0000 mL | Freq: Once | INTRAVENOUS | Status: AC
  Administered 2020-03-16: 1000 mL via INTRAVENOUS

## 2020-03-16 MED ORDER — SODIUM CHLORIDE 0.45 % IV BOLUS
500.0000 mL | Freq: Once | INTRAVENOUS | Status: AC
  Administered 2020-03-16: 500 mL via INTRAVENOUS

## 2020-03-16 MED ORDER — POTASSIUM CHLORIDE 10 MEQ/100ML IV SOLN
10.0000 meq | INTRAVENOUS | Status: AC
  Administered 2020-03-16 (×3): 10 meq via INTRAVENOUS
  Filled 2020-03-16 (×3): qty 100

## 2020-03-16 MED ORDER — NOREPINEPHRINE 4 MG/250ML-% IV SOLN
0.0000 ug/min | INTRAVENOUS | Status: DC
  Administered 2020-03-16: 5 ug/min via INTRAVENOUS
  Administered 2020-03-17: 10 ug/min via INTRAVENOUS
  Administered 2020-03-17: 8 ug/min via INTRAVENOUS
  Administered 2020-03-17: 10 ug/min via INTRAVENOUS
  Administered 2020-03-18: 8 ug/min via INTRAVENOUS
  Administered 2020-03-18: 7 ug/min via INTRAVENOUS
  Administered 2020-03-19: 4 ug/min via INTRAVENOUS
  Filled 2020-03-16 (×8): qty 250

## 2020-03-16 MED ORDER — ENOXAPARIN SODIUM 40 MG/0.4ML ~~LOC~~ SOLN
40.0000 mg | SUBCUTANEOUS | Status: DC
  Administered 2020-03-16 – 2020-03-24 (×9): 40 mg via SUBCUTANEOUS
  Filled 2020-03-16 (×11): qty 0.4

## 2020-03-16 MED ORDER — CHLORHEXIDINE GLUCONATE CLOTH 2 % EX PADS
6.0000 | MEDICATED_PAD | Freq: Every day | CUTANEOUS | Status: AC
  Administered 2020-03-16 – 2020-03-20 (×4): 6 via TOPICAL

## 2020-03-16 MED ORDER — LACTATED RINGERS IV BOLUS
1800.0000 mL | Freq: Once | INTRAVENOUS | Status: AC
  Administered 2020-03-16: 1000 mL via INTRAVENOUS

## 2020-03-16 NOTE — ED Notes (Signed)
Pt placed on primo-fit due to urinary incontenance

## 2020-03-16 NOTE — H&P (Signed)
NAME:  Roberto Rhodes, MRN:  [id number], DOB:  1961/02/13, LOS: 0 ADMISSION DATE:  2020-03-22,  , CHIEF COMPLAINT: Altered mental status  Brief History   59 year old male presented with DKA  History of present illness   This is a 59 year old white male that presented from home to the emergency room.  Family friend found the patient extremely weak and stated to EMS that the patient has been weak for several days.  The patient is awake and alert but unable to provide a clear history.  He denies any physical complaints.  He understands that he is currently in the emergency room but does not know why he was brought there.  Patient appears disheveled but does not appear in distress.  Past Medical History  Diabetes mellitus type 2  Significant Hospital Events     Consults:  None  Procedures:  None  Significant Diagnostic Tests:    Micro Data:    Antimicrobials:    Interim history/subjective:    Objective   Blood pressure (!) 86/62, pulse 80, temperature (!) 97.5 F (36.4 C), temperature source Oral, resp. rate 20, SpO2 99 %.        Intake/Output Summary (Last 24 hours) at 03/16/2020 0154 Last data filed at 03/16/2020 0149 Gross per 24 hour  Intake 3700 ml  Output --  Net 3700 ml   There were no vitals filed for this visit.  Examination: General: No acute distress, confused HENT: Mucous membranes are dry, atraumatic/normocephalic Lungs: CTAB, no wheezing rales or rhonchi noted Cardiovascular: Regular rate no murmur rub or gallop appreciated Abdomen: Soft, nontender distended positive bowel sounds no rebound/rigidity/guarding Extremities: +3 edema of the lower extremities.  Distal pulses intact x4.  No cyanosis. Neuro: Awake and alert follows very simple commands.  His verbal but very confused.   Resolved Hospital Problem list     Assessment & Plan:  DKA Intravascular volume depletion Acute renal insufficiency Hypokalemia Lactic acidosis Anemia Altered mental  status  Plan. Patient was admitted to the intensive care unit for further work-up. DKA order set was initiated. Blood pressure remains soft despite 4 L of IV fluids.  Does not require vasoactive support at this time but will need to closely monitor. Closely monitor I's/O's. Transfuse 1 unit PRBC at this time. Serial labs Obtain echocardiogram. Trend troponins  Best practice:  Diet: N.p.o. Pain/Anxiety/Delirium protocol (if indicated): N/AA VAP protocol (if indicated): N/A DVT prophylaxis: SCDs only. GI prophylaxis: Protonix Glucose control: Endo tool Mobility: Bedrest Code Status: Full Family Communication: Not available Disposition: Admit to ICU  Labs   CBC: Recent Labs  Lab 2020-03-22 2000  WBC 16.6*  NEUTROABS 12.1*  HGB 7.0*  HCT 21.6*  MCV 105.9*  PLT 171    Basic Metabolic Panel: Recent Labs  Lab 2020/03/22 2000 2020/03/22 2237  NA 137 137  K 2.9* 4.8  CL 96* 104  CO2 21* 21*  GLUCOSE 1,171* 904*  BUN 32* 28*  CREATININE 1.75* 1.50*  CALCIUM 8.3* 7.6*   GFR: CrCl cannot be calculated (Unknown ideal weight.). Recent Labs  Lab March 22, 2020 2000  WBC 16.6*  LATICACIDVEN 6.9*    Liver Function Tests: Recent Labs  Lab 2020/03/22 2000  AST 104*  ALT 60*  ALKPHOS 204*  BILITOT 1.7*  PROT 7.2  ALBUMIN 2.3*   No results for input(s): LIPASE, AMYLASE in the last 168 hours. Recent Labs  Lab 03-22-20 2237  AMMONIA 45*    ABG    Component Value Date/Time   HCO3 24.0  03/12/2020 2038   O2SAT 86.2 03/13/2020 2038     Coagulation Profile: Recent Labs  Lab 02/16/2020 2000  INR 1.4*    Cardiac Enzymes: No results for input(s): CKTOTAL, CKMB, CKMBINDEX, TROPONINI in the last 168 hours.  HbA1C: No results found for: HGBA1C  CBG: Recent Labs  Lab 02/16/2020 2238 02/21/2020 2332 03/07/2020 2358 03/16/20 0037 03/16/20 0112  GLUCAP >600* >600* >600* 530* 481*    Review of Systems:   Unable to obtain reliable review of systems secondary to  patient's confusion.  Past Medical History  Diabetes mellitus type 2 Hypertension EtOH abuse Prior smoker quit 1997   Surgical History   Unknown  Social History   reports that he has never smoked. He has never used smokeless tobacco. He reports that he does not drink alcohol and does not use drugs.   Family History   His family history is not on file.   Allergies Allergies  Allergen Reactions   Penicillin G Shortness Of Breath   Penicillins Anaphylaxis     Home Medications  Prior to Admission medications   Medication Sig Start Date End Date Taking? Authorizing Provider  acetaminophen (TYLENOL) 500 MG tablet Take 1,000 mg by mouth every 6 (six) hours as needed (pain).    [provider]  busPIRone (BUSPAR) 5 MG tablet Take 5 mg by mouth 2 (two) times daily. 11/11/19   [provider]  gabapentin (NEURONTIN) 300 MG capsule Take 300 mg by mouth 3 (three) times daily.    [provider]  glipiZIDE (GLUCOTROL) 5 MG tablet Take 5 mg by mouth daily before breakfast.    [provider]  hydrochlorothiazide (MICROZIDE) 12.5 MG capsule Take 12.5 mg by mouth daily.    [provider]  ibuprofen (ADVIL,MOTRIN) 800 MG tablet Take 800 mg by mouth every 8 (eight) hours as needed.    [provider]  lisinopril (PRINIVIL,ZESTRIL) 20 MG tablet Take 20 mg by mouth 2 (two) times daily.    [provider]  metFORMIN (GLUCOPHAGE) 850 MG tablet Take 850 mg by mouth 2 (two) times daily with a meal.    [provider]  metFORMIN (GLUCOPHAGE-XR) 750 MG 24 hr tablet Take 750 mg by mouth daily. 11/11/19   [provider]  omeprazole (PRILOSEC) 40 MG capsule Take 40 mg by mouth daily.    [provider]  propranolol (INDERAL) 20 MG tablet Take 20 mg by mouth 3 (three) times daily. 10/06/19   [provider]     Critical care time: 

## 2020-03-16 NOTE — ED Notes (Signed)
Wynona Neat, MD aware of soft pressures. No new orders.

## 2020-03-16 NOTE — ED Notes (Signed)
ECHO at bedside.

## 2020-03-16 NOTE — ED Notes (Signed)
Pt. Documented in error see above note in chart. 

## 2020-03-16 NOTE — Progress Notes (Signed)
Patient is awake alert interactive  Slow to answer questions but appropriately answers questions  CT scan shows bibasal infiltrate CT abdomen with thickening perirectally  We will start him on azithromycin and Rocephin for community-acquired pneumonia  Soft blood pressures may be as a result of pneumonia/sepsis  Continue to monitor

## 2020-03-16 NOTE — ED Notes (Addendum)
Wynona Neat, MD aware of continued soft pressures. No new orders.

## 2020-03-16 NOTE — Progress Notes (Signed)
eLink Physician-Brief Progress Note Patient Name: Roberto Rhodes DOB: 1960-11-12 MRN: [id number]   Date of Service  03/16/2020  HPI/Events of Note  Patient admitted last night with DKA, he is hypotensive tonight despite > 4 liters of iv crystalloid resuscitation in the past 24 hours. ECHO done earlier today shows a normal bi-ventricular  Function with an underloaded LV.  eICU Interventions  NS 10000 ml iv bolus x 1, will start peripheral Levophed since his SBP is 65  mmHg, and plan to wean once he is fully volume resuscitated with a normalizing blood pressure, arterial line for more reliable BP monitoring, PICC to enable access for vasopressors if needed. New Patient Evaluation completed.        [name] [name] 03/16/2020, 8:59 PM

## 2020-03-16 NOTE — Progress Notes (Signed)
NAME:  Roberto Rhodes, MRN:  [id number], DOB:  [birth year], LOS: 0 ADMISSION DATE:  02/18/2020,  , CHIEF COMPLAINT: Altered mental status  Brief History   59 year old male presented with DKA Has had a cough, diarrhea, progressive weakness   History of present illness   This is a 59 year old white male that presented from home to the emergency room.  Family friend found the patient extremely weak and stated to EMS that the patient has been weak for several days.  The patient is awake and alert but unable to provide a clear history.  He denies any physical complaints.  He understands that he is currently in the emergency room but does not know why he was brought there.  Patient appears disheveled but does not appear in distress.  Past Medical History  Diabetes mellitus type 2  Significant Hospital Events     Consults:  None  Procedures:  None  Significant Diagnostic Tests:    Micro Data:  9/29 blood cultures 9/29 influenza negative, coronavirus negative Antimicrobials:  Vancomycin 9/29>> Cefepime 9/29 >> Interim history/subjective:  Been fluid resuscitated Awake, alert, interactive  Objective   Blood pressure (!) 95/58, pulse 72, temperature (!) 97.5 F (36.4 C), temperature source Oral, resp. rate 15, SpO2 96 %.        Intake/Output Summary (Last 24 hours) at 03/16/2020 0750 Last data filed at 03/16/2020 0428 Gross per 24 hour  Intake 5300.06 ml  Output --  Net 5300.06 ml   There were no vitals filed for this visit.  Examination: General: Does not appear to be in distress, disheveled, dry oral mucosa HENT: Dry oral mucosa, Lungs: Clear breath sounds bilaterally, decreased at the bases Cardiovascular: S1-S2 appreciated Abdomen: Soft, bowel sounds appreciated Extremities: Minimal edema Neuro: Awake and alert follows very simple commands.  His verbal but very confused.  Chest x-ray reviewed by myself showing right infrahilar process  Resolved Hospital Problem list      Assessment & Plan:  DKA Intravascular volume depletion -Being fluid resuscitated -Endo tool in place  Acute renal insufficiency -Improving with resuscitation   Hypokalemia -Related to DKA -Potassium being repleted  Lactic acidosis -Will trend  Anemia Altered mental status  thiamine  Follow-up of admission to the ICU Continue fluid resuscitation Follow-up on echocardiogram 1 unit packed red cell transfusion ordered History of alcohol use disorder Iron deficiency anemia History of polysubstance abuse-unclear what he was using in the past apart from alcohol  Abnormal CT scan of the abdomen and pelvis from 06/26/2017 revealing circumferential wall thickening in the rectum with ill definition of the rectal wall, do not see any follow-up with GI or follow-up evaluation -He does have positive Hemoccult -Repeat CT abdomen and pelvis  Remains critically ill   Best practice:  Diet: N.p.o. Pain/Anxiety/Delirium protocol (if indicated): N/AA VAP protocol (if indicated): N/A DVT prophylaxis: SCDs only. GI prophylaxis: Protonix Glucose control: Endo tool Mobility: Bedrest Code Status: Full Family Communication: Not available Disposition: Admit to ICU  Labs   CBC: Recent Labs  Lab 03/09/2020 2000 03/16/20 0049  WBC 16.6* 14.3*  NEUTROABS 12.1*  --   HGB 7.0* 8.7*  HCT 21.6* 26.0*  MCV 105.9* 101.2*  PLT 171 120*    Basic Metabolic Panel: Recent Labs  Lab 03/08/2020 2000 03/07/2020 2237 03/16/20 0049 03/16/20 0530  NA 137 137 149* 150*  K 2.9* 4.8 2.7* 2.1*  CL 96* 104 108 111  CO2 21* 21* 20* 22  GLUCOSE 1,171* 904* 364* 193*  BUN 32*  28* 25* 22*  CREATININE 1.75* 1.50* 1.37* 1.20  CALCIUM 8.3* 7.6* 8.4* 8.3*   GFR: CrCl cannot be calculated (Unknown ideal weight.). Recent Labs  Lab 03/10/2020 2000 03/16/20 0049  WBC 16.6* 14.3*  LATICACIDVEN 6.9*  --     Liver Function Tests: Recent Labs  Lab 03/09/2020 2000  AST 104*  ALT 60*  ALKPHOS 204*   BILITOT 1.7*  PROT 7.2  ALBUMIN 2.3*   No results for input(s): LIPASE, AMYLASE in the last 168 hours. Recent Labs  Lab 02/20/2020 2237  AMMONIA 45*    ABG    Component Value Date/Time   HCO3 24.0 03/08/2020 2038   O2SAT 86.2 03/05/2020 2038     Coagulation Profile: Recent Labs  Lab 02/21/2020 2000  INR 1.4*    Cardiac Enzymes: No results for input(s): CKTOTAL, CKMB, CKMBINDEX, TROPONINI in the last 168 hours.  HbA1C: No results found for: HGBA1C  CBG: Recent Labs  Lab 03/16/20 0217 03/16/20 0318 03/16/20 0415 03/16/20 0516 03/16/20 0617  GLUCAP 327* 274* 191* 172* 154*    Review of Systems:   Unable to obtain reliable review of systems secondary to patient's confusion.  Past Medical History  Diabetes mellitus type 2 Hypertension EtOH abuse Prior smoker quit 1997   Surgical History   Never smoker history from records  Social History   reports that he has never smoked. He has never used smokeless tobacco. He reports that he does not drink alcohol and does not use drugs.   Family History   His family history is not on file.   Allergies Allergies  Allergen Reactions  . Penicillin G Shortness Of Breath  . Penicillins Anaphylaxis     Home Medications  Prior to Admission medications   Medication Sig Start Date End Date Taking? Authorizing Provider  acetaminophen (TYLENOL) 500 MG tablet Take 1,000 mg by mouth every 6 (six) hours as needed (pain).    [provider]  busPIRone (BUSPAR) 5 MG tablet Take 5 mg by mouth 2 (two) times daily. 11/11/19   [provider]  gabapentin (NEURONTIN) 300 MG capsule Take 300 mg by mouth 3 (three) times daily.    [provider]  glipiZIDE (GLUCOTROL) 5 MG tablet Take 5 mg by mouth daily before breakfast.    [provider]  hydrochlorothiazide (MICROZIDE) 12.5 MG capsule Take 12.5 mg by mouth daily.    [provider]  ibuprofen (ADVIL,MOTRIN) 800 MG tablet Take 800 mg by  mouth every 8 (eight) hours as needed.    [provider]  lisinopril (PRINIVIL,ZESTRIL) 20 MG tablet Take 20 mg by mouth 2 (two) times daily.    [provider]  metFORMIN (GLUCOPHAGE) 850 MG tablet Take 850 mg by mouth 2 (two) times daily with a meal.    [provider]  metFORMIN (GLUCOPHAGE-XR) 750 MG 24 hr tablet Take 750 mg by mouth daily. 11/11/19   [provider]  omeprazole (PRILOSEC) 40 MG capsule Take 40 mg by mouth daily.    [provider]  propranolol (INDERAL) 20 MG tablet Take 20 mg by mouth 3 (three) times daily. 10/06/19   [provider]     The patient is critically ill with multiple organ systems failure and requires high complexity decision making for assessment and support, frequent evaluation and titration of therapies, application of advanced monitoring technologies and extensive interpretation of multiple databases. Critical Care Time devoted to patient care services described in this note independent of APP/resident time (if  applicable)  is 40 minutes.   Virl Diamond MD [facility] Personal pager: [phone] If unanswered, please page CCM On-call: #[phone]

## 2020-03-16 NOTE — Progress Notes (Signed)
  Echocardiogram 2D Echocardiogram has been performed.  Tye Savoy 03/16/2020, 10:43 AM

## 2020-03-16 NOTE — Procedures (Signed)
Arterial Catheter Insertion Procedure Note  Lenus Trauger  [id number]  26-Oct-1960  Date:03/16/20  Time:11:47 PM    Provider Performing: Rulon Eisenmenger    Procedure: Insertion of Arterial Line (23300) without US guidance  Indication(s) Blood pressure monitoring and/or need for frequent ABGs  Consent Unable to obtain consent due to emergent nature of procedure.  Anesthesia None   Time Out Verified patient identification, verified procedure, site/side was marked, verified correct patient position, special equipment/implants available, medications/allergies/relevant history reviewed, required imaging and test results available.   Sterile Technique Maximal sterile technique including full sterile barrier drape, hand hygiene, sterile gown, sterile gloves, mask, hair covering, sterile ultrasound probe cover (if used).   Procedure Description Area of catheter insertion was cleaned with chlorhexidine and draped in sterile fashion. Without real-time ultrasound guidance an arterial catheter was placed into the right radial artery.  Appropriate arterial tracings confirmed on monitor.     Complications/Tolerance None; patient tolerated the procedure well.   EBL Minimal   Specimen(s) None

## 2020-03-17 DIAGNOSIS — N179 Acute kidney failure, unspecified: Secondary | ICD-10-CM

## 2020-03-17 DIAGNOSIS — A419 Sepsis, unspecified organism: Principal | ICD-10-CM

## 2020-03-17 DIAGNOSIS — F1910 Other psychoactive substance abuse, uncomplicated: Secondary | ICD-10-CM | POA: Diagnosis present

## 2020-03-17 DIAGNOSIS — E870 Hyperosmolality and hypernatremia: Secondary | ICD-10-CM

## 2020-03-17 DIAGNOSIS — K529 Noninfective gastroenteritis and colitis, unspecified: Secondary | ICD-10-CM

## 2020-03-17 DIAGNOSIS — E1110 Type 2 diabetes mellitus with ketoacidosis without coma: Secondary | ICD-10-CM

## 2020-03-17 DIAGNOSIS — IMO0002 Reserved for concepts with insufficient information to code with codable children: Secondary | ICD-10-CM | POA: Diagnosis present

## 2020-03-17 DIAGNOSIS — R6521 Severe sepsis with septic shock: Secondary | ICD-10-CM

## 2020-03-17 DIAGNOSIS — J189 Pneumonia, unspecified organism: Secondary | ICD-10-CM | POA: Diagnosis present

## 2020-03-17 LAB — CBC
HCT: 27.1 % — ABNORMAL LOW (ref 39.0–52.0)
Hemoglobin: 9.2 g/dL — ABNORMAL LOW (ref 13.0–17.0)
MCH: 33.3 pg (ref 26.0–34.0)
MCHC: 33.9 g/dL (ref 30.0–36.0)
MCV: 98.2 fL (ref 80.0–100.0)
Platelets: 120 10*3/uL — ABNORMAL LOW (ref 150–400)
RBC: 2.76 MIL/uL — ABNORMAL LOW (ref 4.22–5.81)
RDW: 14.7 % (ref 11.5–15.5)
WBC: 16.1 10*3/uL — ABNORMAL HIGH (ref 4.0–10.5)
nRBC: 3.2 % — ABNORMAL HIGH (ref 0.0–0.2)

## 2020-03-17 LAB — BASIC METABOLIC PANEL
Anion gap: 13 (ref 5–15)
BUN: 11 mg/dL (ref 6–20)
CO2: 22 mmol/L (ref 22–32)
Calcium: 7.7 mg/dL — ABNORMAL LOW (ref 8.9–10.3)
Chloride: 116 mmol/L — ABNORMAL HIGH (ref 98–111)
Creatinine, Ser: 0.87 mg/dL (ref 0.61–1.24)
GFR calc Af Amer: >60 mL/min (ref >60)
GFR calc non Af Amer: >60 mL/min (ref >60)
Glucose, Bld: 132 mg/dL — ABNORMAL HIGH (ref 70–99)
Potassium: 2.6 mmol/L — CL (ref 3.5–5.1)
Sodium: 151 mmol/L — ABNORMAL HIGH (ref 135–145)

## 2020-03-17 LAB — GLUCOSE, CAPILLARY
Glucose-Capillary: 122 mg/dL — ABNORMAL HIGH (ref 70–99)
Glucose-Capillary: 104 mg/dL — ABNORMAL HIGH (ref 70–99)
Glucose-Capillary: 193 mg/dL — ABNORMAL HIGH (ref 70–99)
Glucose-Capillary: 178 mg/dL — ABNORMAL HIGH (ref 70–99)

## 2020-03-17 LAB — LACTIC ACID, PLASMA
Lactic Acid, Venous: 4 mmol/L (ref 0.5–1.9)
Lactic Acid, Venous: 2.5 mmol/L (ref 0.5–1.9)
Lactic Acid, Venous: 4 mmol/L (ref 0.5–1.9)

## 2020-03-17 LAB — HEMOGLOBIN A1C
Hgb A1c MFr Bld: >15.5 % — ABNORMAL HIGH (ref 4.8–5.6)
Mean Plasma Glucose: >398 mg/dL

## 2020-03-17 LAB — PROCALCITONIN: Procalcitonin: 0.94 ng/mL

## 2020-03-17 MED ORDER — POTASSIUM CHLORIDE 2 MEQ/ML IV SOLN: INTRAVENOUS | Status: DC

## 2020-03-17 MED ORDER — THIAMINE HCL 100 MG PO TABS
100.0000 mg | ORAL_TABLET | Freq: Every day | ORAL | Status: DC
  Administered 2020-03-18 – 2020-03-23 (×6): 100 mg via ORAL
  Filled 2020-03-17 (×6): qty 1

## 2020-03-17 MED ORDER — SODIUM CHLORIDE 0.9 % IV SOLN
2.0000 g | Freq: Three times a day (TID) | INTRAVENOUS | Status: DC
  Administered 2020-03-17 – 2020-03-20 (×10): 2 g via INTRAVENOUS
  Filled 2020-03-17 (×10): qty 2

## 2020-03-17 MED ORDER — METRONIDAZOLE 500 MG PO TABS
500.0000 mg | ORAL_TABLET | Freq: Three times a day (TID) | ORAL | Status: DC
  Administered 2020-03-17 – 2020-03-23 (×20): 500 mg via ORAL
  Filled 2020-03-17 (×21): qty 1

## 2020-03-17 MED ORDER — SODIUM CHLORIDE 0.9 % IV SOLN: INTRAVENOUS | Status: DC | PRN

## 2020-03-17 MED ORDER — SODIUM CHLORIDE 0.9% FLUSH: 10.0000 mL | INTRAVENOUS | Status: DC | PRN

## 2020-03-17 MED ORDER — POTASSIUM CHLORIDE 10 MEQ/100ML IV SOLN
10.0000 meq | INTRAVENOUS | Status: AC
  Administered 2020-03-17 (×4): 10 meq via INTRAVENOUS
  Filled 2020-03-17 (×4): qty 100

## 2020-03-17 MED ORDER — ORAL CARE MOUTH RINSE
15.0000 mL | Freq: Two times a day (BID) | OROMUCOSAL | Status: DC
  Administered 2020-03-17 – 2020-03-18 (×3): 15 mL via OROMUCOSAL

## 2020-03-17 MED ORDER — LINEZOLID 600 MG PO TABS
600.0000 mg | ORAL_TABLET | Freq: Two times a day (BID) | ORAL | Status: DC
  Administered 2020-03-17 – 2020-03-20 (×7): 600 mg via ORAL
  Filled 2020-03-17 (×7): qty 1

## 2020-03-17 MED ORDER — CHLORHEXIDINE GLUCONATE 0.12 % MT SOLN
15.0000 mL | Freq: Two times a day (BID) | OROMUCOSAL | Status: DC
  Administered 2020-03-17 – 2020-03-24 (×14): 15 mL via OROMUCOSAL
  Filled 2020-03-17 (×12): qty 15

## 2020-03-17 MED ORDER — KCL IN DEXTROSE-NACL 40-5-0.45 MEQ/L-%-% IV SOLN
INTRAVENOUS | Status: DC
  Filled 2020-03-17 (×3): qty 1000

## 2020-03-17 MED ORDER — SODIUM CHLORIDE 0.9% FLUSH
10.0000 mL | Freq: Two times a day (BID) | INTRAVENOUS | Status: DC
  Administered 2020-03-17 – 2020-03-19 (×5): 10 mL
  Administered 2020-03-20 (×2): 20 mL
  Administered 2020-03-21 – 2020-03-24 (×5): 10 mL

## 2020-03-17 NOTE — Progress Notes (Signed)
Peripherally Inserted Central Catheter Placement  The IV Nurse has discussed with the patient and/or persons authorized to consent for the patient, the purpose of this procedure and the potential benefits and risks involved with this procedure.  The benefits include less needle sticks, lab draws from the catheter, and the patient may be discharged home with the catheter. Risks include, but not limited to, infection, bleeding, blood clot (thrombus formation), and puncture of an artery; nerve damage and irregular heartbeat and possibility to perform a PICC exchange if needed/ordered by physician.  Alternatives to this procedure were also discussed.  Bard Power PICC patient education guide, fact sheet on infection prevention and patient information card has been provided to patient /or left at bedside.    PICC Placement Documentation  PICC Triple Lumen 03/17/20 PICC Right Basilic 39 cm 1 cm (Active)  Indication for Insertion or Continuance of Line Vasoactive infusions 03/17/20 1319  Exposed Catheter (cm) 1 cm 03/17/20 1319  Site Assessment Clean;Dry;Intact 03/17/20 1319  Lumen #1 Status Flushed;Saline locked;Blood return noted 03/17/20 1319  Lumen #2 Status Flushed;Saline locked;Blood return noted 03/17/20 1319  Lumen #3 Status Flushed;Saline locked;Blood return noted 03/17/20 1319  Dressing Type Transparent 03/17/20 1319  Dressing Status Clean;Dry;Intact 03/17/20 1319  Antimicrobial disc in place? Yes 03/17/20 1319  Safety Lock Not Applicable 03/17/20 1319  Line Care Connections checked and tightened 03/17/20 1319  Line Adjustment (NICU/IV Team Only) No 03/17/20 1319  Dressing Change Due 03/24/20 03/17/20 1319       Safiyyah Vasconez, Lajean Manes 03/17/2020, 1:20 PM

## 2020-03-17 NOTE — Progress Notes (Signed)
Attempted aline x 2 therapist Aline unsuccessful RN notified.

## 2020-03-17 NOTE — Progress Notes (Signed)
Inpatient Diabetes Program Recommendations  AACE/ADA: New Consensus Statement on Inpatient Glycemic Control (2015)  Target Ranges:  Prepandial:   less than 140 mg/dL      Peak postprandial:   less than 180 mg/dL (1-2 hours)      Critically ill patients:  140 - 180 mg/dL   Lab Results  Component Value Date   GLUCAP 122 (H) 03/17/2020    Review of Glycemic Control  Diabetes history: DM2 Outpatient Diabetes medications: Lantus 35 units QHS, Humalog 4-12 units tidwc, glipizide 5 mg ac Breakfast, metfomin 750 mg QD Current orders for Inpatient glycemic control: Levemir 22 units bid, Novolog 0-9 units tidwc and hs + 4 units tidwc (transitioned off insulin drip)  Glucose 1171 mg/dL on presentation. ETOH abuse CBG 132, 122 mg/dL Trending well at present. H/H low therefore HgbA1C would likely not be accurate.   Inpatient Diabetes Program Recommendations:     Agree with orders. Continue to follow.  Thank you. Ailene Ards, RD, LDN, CDE Inpatient Diabetes Coordinator [phone]

## 2020-03-17 NOTE — Progress Notes (Signed)
Consent obtained from sister in law, Donah Driver

## 2020-03-17 NOTE — Progress Notes (Addendum)
PROGRESS NOTE  Roberto Rhodes EEF:[id number] DOB: [birth year] DOA: 03/14/2020 PCP: Pcp, No  HPI/Recap of past 35 hours: 59 year old male with reported history of polysubstance abuse in the past as well as hypertension and diabetes mellitus, noncompliant with medications who was admitted on 9/29 and septic shock and secondary DKA.  Patient was found to have a lactic acid level of 6.4.  Source of infection felt to be multi lobar pneumonia.  He was also noted to have some diarrhea and a CT scan of the abdomen noted sigmoid colitis.  Despite aggressive fluid resuscitation, blood pressure still remained low and he was placed on pressor support.  Patient started on IV fluids and antibiotics.  Patient's DKA has since resolved and CBGs have stabilized and insulin drip able to be weaned off.  However, he remains on pressor support as he is still intravascularly volume depleted.  Continue to get aggressive IV fluids, broad-spectrum antibiotics.  Patient himself feels a little bit tired.  Denies any pain and feels like his breathing is okay.  Assessment/Plan: Principal Problem:   Septic shock ([place]) secondary to multifocal pneumonia: Patient met criteria for sepsis on admission given tachycardia, tachypnea, marked leukocytosis and hypotension not relieved with IV fluids requiring pressor support plus a lactic acid level of 6.9.  Continue broad-spectrum antibiotics.  Patient's white blood cell count slowly trending downward.  Repeat labs this morning are pending.  Lactic acid level trending downward.  Fortunately, not hypoxic. Active Problems:   DKA (diabetic ketoacidoses) with underlying uncontrolled diabetes mellitus: Caused by multifocal pneumonia but also severe underlying uncontrolled diabetes mellitus.Patient sister confirms that patient has been noncompliant with his medications.  A1c greater than 15.5.  Currently he is on sliding scale and Lantus.   Acute hypernatremia/hypokalemia: Initially when patient came  in, sodium levels falsely normal appearing at 137 however when you account for his hyperglycemia, markedly elevated.  This is likely secondary to severe underlying volume depletion.  He is currently on half-normal saline plus replacement potassium    Colitis, with diarrhea: Checking C. difficile PCR.  Will change cefepime to Zosyn to cover anaerobes.    Polysubstance abuse [facility]): Patient reported also abuses alcohol.  Alcohol screen was negative when he came in.  Urine drug screen pending.    AKI (acute kidney injury) ([place]): Secondary to DKA.  Resolved with aggressive fluid resuscitation:   Code Status: Full code  Family Communication: Updated sister by phone  Disposition Plan: Continue in ICU, need to wean off of pressor support and stabilize blood pressure.   Consultants:  Critical care  Procedures:  PICC line placement 10/1  Pressor support started 9/30  Antimicrobials:  IV Rocephin and Zithromax 9/29-10/1  IV Zosyn 10/1-present  DVT prophylaxis:  **Lovenox*  I have spent 40 minutes in the care of this critically ill patient including interpretation of labs, discussion of care with specialist, bedside examination and medical decision making.  Objective: Vitals:   03/17/20 1100 03/17/20 1200  BP: (!) 92/58 104/68  Pulse: 66 67  Resp: (!) 22 19  Temp:  97.8 F (36.6 C)  SpO2: 97% 96%    Intake/Output Summary (Last 24 hours) at 03/17/2020 1314 Last data filed at 03/17/2020 1200 Gross per 24 hour  Intake 5488.76 ml  Output 927 ml  Net 4561.76 ml   Filed Weights   03/16/20 1157  Weight: 72.6 kg   Body mass index is 22.96 kg/m.  Exam:   General: Alert and oriented x3, no acute distress  HEENT: Normocephalic  atraumatic, mucous membranes are dry  Neck: Supple, no JVD  Cardiovascular: Regular rate and rhythm, S1-S2  Respiratory: Scattered rails  Abdomen: Soft, mild tenderness in the left lower quadrant, nondistended, hypoactive bowel  sounds  Musculoskeletal: No clubbing or cyanosis or edema  Skin: No skin breaks, tears or lesions  Neuro: No focal deficits  Psychiatry: Appropriate, no evidence of psychoses   Data Reviewed: CBC: Recent Labs  Lab 02/25/2020 2000 03/16/20 0049  WBC 16.6* 14.3*  NEUTROABS 12.1*  --   HGB 7.0* 8.7*  HCT 21.6* 26.0*  MCV 105.9* 101.2*  PLT 171 433*   Basic Metabolic Panel: Recent Labs  Lab 03/16/20 0530 03/16/20 1049 03/16/20 1315 03/16/20 1928 03/17/20 0605  NA 150* 146* 147* 147* 151*  K 2.1* 3.0* 3.2* 4.0 2.6*  CL 111 109 114* 112* 116*  CO2 22 23 21* 23 22  GLUCOSE 193* 148* 115* 131* 132*  BUN 22* 21* 18 16 11   CREATININE 1.20 1.06 0.97 1.11 0.87  CALCIUM 8.3* 7.7* 7.1* 7.9* 7.7*   GFR: Estimated Creatinine Clearance: 92.7 mL/min (by C-G formula based on SCr of 0.87 mg/dL). Liver Function Tests: Recent Labs  Lab 02/16/2020 2000  AST 104*  ALT 60*  ALKPHOS 204*  BILITOT 1.7*  PROT 7.2  ALBUMIN 2.3*   No results for input(s): LIPASE, AMYLASE in the last 168 hours. Recent Labs  Lab 03/11/2020 2237  AMMONIA 45*   Coagulation Profile: Recent Labs  Lab 02/22/2020 2000  INR 1.4*   Cardiac Enzymes: No results for input(s): CKTOTAL, CKMB, CKMBINDEX, TROPONINI in the last 168 hours. BNP (last 3 results) No results for input(s): PROBNP in the last 8760 hours. HbA1C: Recent Labs    03/16/20 0051  HGBA1C >15.5*   CBG: Recent Labs  Lab 03/16/20 1810 03/16/20 1850 03/16/20 2221 03/17/20 0732 03/17/20 1214  GLUCAP 69* 79 137* 122* 104*   Lipid Profile: No results for input(s): CHOL, HDL, LDLCALC, TRIG, CHOLHDL, LDLDIRECT in the last 72 hours. Thyroid Function Tests: No results for input(s): TSH, T4TOTAL, FREET4, T3FREE, THYROIDAB in the last 72 hours. Anemia Panel: Recent Labs    02/16/2020 2000 03/16/20 0000  VITAMINB12  --  1,040*  FOLATE  --  >100.0  FERRITIN  --  1,990*  TIBC  --  113*  IRON  --  117  RETICCTPCT 1.5  --    Urine  analysis:    Component Value Date/Time   COLORURINE YELLOW 03/16/2020 1559   APPEARANCEUR CLEAR 03/16/2020 1559   LABSPEC 1.038 (H) 03/16/2020 1559   PHURINE 7.0 03/16/2020 1559   GLUCOSEU 150 (A) 03/16/2020 1559   HGBUR SMALL (A) 03/16/2020 1559   BILIRUBINUR NEGATIVE 03/16/2020 1559   KETONESUR NEGATIVE 03/16/2020 1559   PROTEINUR NEGATIVE 03/16/2020 1559   NITRITE NEGATIVE 03/16/2020 1559   LEUKOCYTESUR NEGATIVE 03/16/2020 1559   Sepsis Labs: @LABRCNTIP (procalcitonin:4,lacticidven:4)  ) Recent Results (from the past 240 hour(s))  Culture, blood (Routine x 2)     Status: None (Preliminary result)   Collection Time: 02/21/2020  8:00 PM   Specimen: BLOOD  Result Value Ref Range Status   Specimen Description   Final    BLOOD RIGHT ANTECUBITAL Performed at [facility], [place] [address] [place], [place] [address]    Special Requests   Final    BOTTLES DRAWN AEROBIC AND ANAEROBIC Blood Culture adequate volume Performed at [facility] [address], [place], [place] [address]    Culture   Final  NO GROWTH 2 DAYS Performed at [facility], [place] [address] [place], [place] [address]    Report Status PENDING  Incomplete  Culture, blood (Routine x 2)     Status: None (Preliminary result)   Collection Time: 02/25/2020  8:00 PM   Specimen: BLOOD  Result Value Ref Range Status   Specimen Description   Final    BLOOD RIGHT ANTECUBITAL Performed at [facility] [address] [place], [place] [address]    Special Requests   Final    BOTTLES DRAWN AEROBIC AND ANAEROBIC Blood Culture results may not be optimal due to an inadequate volume of blood received in culture bottles Performed at [facility] [address] [place], [place] [address]    Culture   Final    NO GROWTH 2 DAYS Performed at [facility] [address] [place], [place] [address]    Report Status PENDING  Incomplete   Respiratory Panel by RT PCR (Flu A&B, Covid) - Nasopharyngeal Swab     Status: None   Collection Time: 02/19/2020  8:32 PM   Specimen: Nasopharyngeal Swab  Result Value Ref Range Status   SARS Coronavirus 2 by RT PCR NEGATIVE NEGATIVE Final    Comment: (NOTE) SARS-CoV-2 target nucleic acids are NOT DETECTED.  The SARS-CoV-2 RNA is generally detectable in upper respiratoy specimens during the acute phase of infection. The lowest concentration of SARS-CoV-2 viral copies this assay can detect is 131 copies/mL. A negative result does not preclude SARS-Cov-2 infection and should not be used as the sole basis for treatment or other patient management decisions. A negative result may occur with  improper specimen collection/handling, submission of specimen other than nasopharyngeal swab, presence of viral mutation(s) within the areas targeted by this assay, and inadequate number of viral copies (<131 copies/mL). A negative result must be combined with clinical observations, patient history, and epidemiological information. The expected result is Negative.  Fact Sheet for Patients:  PinkCheek.be  Fact Sheet for Healthcare Providers:  GravelBags.it  This test is no t yet approved or cleared by the [place] FDA and  has been authorized for detection and/or diagnosis of SARS-CoV-2 by FDA under an Emergency Use Authorization (EUA). This EUA will remain  in effect (meaning this test can be used) for the duration of the COVID-19 declaration under Section 564(b)(1) of the Act, 21 U.S.C. section 360bbb-3(b)(1), unless the authorization is terminated or revoked sooner.     Influenza A by PCR NEGATIVE NEGATIVE Final   Influenza B by PCR NEGATIVE NEGATIVE Final    Comment: (NOTE) The Xpert Xpress SARS-CoV-2/FLU/RSV assay is intended as an aid in  the diagnosis of influenza from Nasopharyngeal swab specimens and  should not be used as  a sole basis for treatment. Nasal washings and  aspirates are unacceptable for Xpert Xpress SARS-CoV-2/FLU/RSV  testing.  Fact Sheet for Patients: PinkCheek.be  Fact Sheet for Healthcare Providers: GravelBags.it  This test is not yet approved or cleared by the [place] FDA and  has been authorized for detection and/or diagnosis of SARS-CoV-2 by  FDA under an Emergency Use Authorization (EUA). This EUA will remain  in effect (meaning this test can be used) for the duration of the  Covid-19 declaration under Section 564(b)(1) of the Act, 21  U.S.C. section 360bbb-3(b)(1), unless the authorization is  terminated or revoked. Performed at [facility], [place] [address] [place], [place] [address]   MRSA PCR Screening  Status: Abnormal   Collection Time: 03/16/20  9:29 AM   Specimen: Nasal Mucosa; Nasopharyngeal  Result Value Ref Range Status   MRSA by PCR POSITIVE (A) NEGATIVE Final    Comment:        The GeneXpert MRSA Assay (FDA approved for NASAL specimens only), is one component of a comprehensive MRSA colonization surveillance program. It is not intended to diagnose MRSA infection nor to guide or monitor treatment for MRSA infections. RESULT CALLED TO, READ BACK BY AND VERIFIED WITH: WEST, S. RN @1117  ON 9.30.2021 BY [facility] Performed at [facility], [place] [address], [place], [place] [address]       Studies: [place] EKG SITE RITE  Result Date: 03/16/2020 If Site Rite image not attached, placement could not be confirmed due to current cardiac rhythm.   Scheduled Meds: . chlorhexidine  15 mL Mouth Rinse BID  . Chlorhexidine Gluconate Cloth  6 each Topical Q0600  . enoxaparin (LOVENOX) injection  40 mg Subcutaneous Q24H  . insulin aspart  0-5 Units Subcutaneous QHS  . insulin aspart  0-9 Units Subcutaneous TID WC  . insulin aspart  4 Units Subcutaneous TID WC  . insulin  detemir  0.3 Units/kg Subcutaneous Q24H  . mouth rinse  15 mL Mouth Rinse q12n4p  . mupirocin ointment  1 application Nasal BID  . thiamine injection  100 mg Intravenous Daily    Continuous Infusions: . azithromycin Stopped (03/17/20 0000)  . cefTRIAXone (ROCEPHIN)  IV 200 mL/hr at 03/16/20 1855  . dextrose 5 % and 0.45 % NaCl with KCl 40 mEq/L 100 mL/hr at 03/17/20 1200  . norepinephrine (LEVOPHED) Adult infusion 9 mcg/min (03/17/20 1200)  . potassium chloride 10 mEq (03/17/20 1223)     LOS: 1 day     Annita Brod, MD [facility]   03/17/2020, 1:14 PM

## 2020-03-17 NOTE — Progress Notes (Signed)
Pharmacy Antibiotic Note  Roberto Rhodes is a 59 y.o. male admitted on 02/22/2020 with pneumonia & colitis.  Pharmacy has been consulted for cefepime dosing. Pt w/ multifocal PNA & colitis, MRSA PCR +. On Levophed.  Says he had anaphylaxis to PCN in past 6 months.  AF WBC 14.3, PCT 0.94, SCr WNL   Plan: Cefepime 2 gm IV q8h Flagyl 500 mg PO q8h Zyvox 600 mg PO q12 Pharmacy to sign off. Will f/u peripherally  Height: 5\' 10"  (177.8 cm) Weight: 72.6 kg (160 lb) IBW/kg (Calculated) : 73  Temp (24hrs), Avg:97.8 F (36.6 C), Min:96.8 F (36 C), Max:98.3 F (36.8 C)  Recent Labs  Lab 03/01/2020 2000 03/08/2020 2237 03/16/20 0049 03/16/20 0049 03/16/20 0530 03/16/20 1049 03/16/20 1315 03/16/20 1928 03/17/20 0605 03/17/20 0948  WBC 16.6*  --  14.3*  --   --   --   --   --   --   --   CREATININE 1.75*   < > 1.37*   < > 1.20 1.06 0.97 1.11 0.87  --   LATICACIDVEN 6.9*  --   --   --   --   --   --   --   --  4.0*   < > = values in this interval not displayed.    Estimated Creatinine Clearance: 92.7 mL/min (by C-G formula based on SCr of 0.87 mg/dL).    Allergies  Allergen Reactions   Penicillin G Shortness Of Breath   Penicillins Anaphylaxis   Antimicrobials this admission:  9/29 cefepime x 1,  10/1>> 10/1 flagyl>> 10/1 zyvox>> 9/29 vanc x 1 9/30 CTX x 1 9/30 azith x 1 Dose adjustments this admission:   Microbiology results:  9/30 MRSA + 9/29 covid/flu A/B neg 9/30 HIV NR 9/29 BCx2: ngtd   Thank you for allowing pharmacy to be a part of this patients care.  10/29, Pharm.D 03/17/2020 2:14 PM

## 2020-03-17 DEATH — deceased

## 2020-03-18 DIAGNOSIS — F1910 Other psychoactive substance abuse, uncomplicated: Secondary | ICD-10-CM

## 2020-03-18 DIAGNOSIS — E872 Acidosis: Secondary | ICD-10-CM

## 2020-03-18 DIAGNOSIS — J189 Pneumonia, unspecified organism: Secondary | ICD-10-CM

## 2020-03-18 LAB — BASIC METABOLIC PANEL
Anion gap: 9 (ref 5–15)
BUN: 9 mg/dL (ref 6–20)
CO2: 20 mmol/L — ABNORMAL LOW (ref 22–32)
Calcium: 7.2 mg/dL — ABNORMAL LOW (ref 8.9–10.3)
Chloride: 115 mmol/L — ABNORMAL HIGH (ref 98–111)
Creatinine, Ser: 0.82 mg/dL (ref 0.61–1.24)
GFR calc Af Amer: >60 mL/min (ref >60)
GFR calc non Af Amer: >60 mL/min (ref >60)
Glucose, Bld: 295 mg/dL — ABNORMAL HIGH (ref 70–99)
Potassium: 3.6 mmol/L (ref 3.5–5.1)
Sodium: 144 mmol/L (ref 135–145)

## 2020-03-18 LAB — GLUCOSE, CAPILLARY
Glucose-Capillary: 255 mg/dL — ABNORMAL HIGH (ref 70–99)
Glucose-Capillary: 152 mg/dL — ABNORMAL HIGH (ref 70–99)
Glucose-Capillary: 164 mg/dL — ABNORMAL HIGH (ref 70–99)
Glucose-Capillary: 107 mg/dL — ABNORMAL HIGH (ref 70–99)

## 2020-03-18 LAB — CBC
HCT: 25 % — ABNORMAL LOW (ref 39.0–52.0)
Hemoglobin: 8.5 g/dL — ABNORMAL LOW (ref 13.0–17.0)
MCH: 33.7 pg (ref 26.0–34.0)
MCHC: 34 g/dL (ref 30.0–36.0)
MCV: 99.2 fL (ref 80.0–100.0)
Platelets: 90 10*3/uL — ABNORMAL LOW (ref 150–400)
RBC: 2.52 MIL/uL — ABNORMAL LOW (ref 4.22–5.81)
RDW: 14.8 % (ref 11.5–15.5)
WBC: 12.5 10*3/uL — ABNORMAL HIGH (ref 4.0–10.5)
nRBC: 4.6 % — ABNORMAL HIGH (ref 0.0–0.2)

## 2020-03-18 LAB — PROCALCITONIN
Procalcitonin: 0.81 ng/mL
Procalcitonin: 0.79 ng/mL

## 2020-03-18 LAB — LACTIC ACID, PLASMA
Lactic Acid, Venous: 3.1 mmol/L (ref 0.5–1.9)
Lactic Acid, Venous: 2.7 mmol/L (ref 0.5–1.9)

## 2020-03-18 MED ORDER — SODIUM CHLORIDE 0.45 % IV SOLN
INTRAVENOUS | Status: DC
  Filled 2020-03-18 (×13): qty 1000

## 2020-03-18 MED ORDER — SODIUM CHLORIDE 0.9 % IV BOLUS
1000.0000 mL | Freq: Once | INTRAVENOUS | Status: AC
  Administered 2020-03-18: 1000 mL via INTRAVENOUS

## 2020-03-18 MED ORDER — LACTATED RINGERS IV BOLUS
1000.0000 mL | Freq: Once | INTRAVENOUS | Status: AC
  Administered 2020-03-18: 1000 mL via INTRAVENOUS

## 2020-03-18 NOTE — Progress Notes (Signed)
PROGRESS NOTE  Roberto Rhodes JXB:[id number] DOB: August 11, 1960 DOA: 02/24/2020 PCP: Pcp, No  HPI/Recap of past 37 hours: 59 year old male with reported history of polysubstance abuse in the past as well as hypertension and diabetes mellitus, noncompliant with medications who was admitted on 9/29 and septic shock and secondary DKA.  Patient was found to have a lactic acid level of 6.4.  Source of infection felt to be multi lobar pneumonia.  He was also noted to have some diarrhea and a CT scan of the abdomen noted sigmoid colitis.  Despite aggressive fluid resuscitation, blood pressure still remained low and he was placed on pressor support.  Patient started on IV fluids and antibiotics.  Patient's DKA has since resolved and CBGs have stabilized and insulin drip able to be weaned off.  However, he remains on pressor support as he is still intravascularly volume depleted.  Continue to get aggressive IV fluids, broad-spectrum antibiotics.    This morning, lactic acid level down to 3 and procalcitonin level down from 0.9 to 0.8.  Patient still on pressor support.  Complains of some mild mid to left lower quadrant abdominal pain and mild diarrhea.  Continued cough with brownish sputum.  Assessment/Plan: Principal Problem:   Septic shock ([place]) secondary to multifocal pneumonia: Patient met criteria for sepsis on admission given tachycardia, tachypnea, marked leukocytosis and hypotension not relieved with IV fluids requiring pressor support plus a lactic acid level of 6.9.  Continue broad-spectrum antibiotics.  Patient's white blood cell count continues to trend downward.  Repeat labs this morning are pending.  Lactic acid level trending downward.  Fortunately, not hypoxic.  Active Problems:    DKA (diabetic ketoacidoses) with underlying uncontrolled diabetes mellitus: Caused by multifocal pneumonia but also severe underlying uncontrolled diabetes mellitus.Patient sister confirms that patient has been  noncompliant with his medications.  A1c greater than 15.5.  Currently he is on sliding scale and Lantus.  When asked if he takes his insulin, patient tells me he takes it every day, however I feel like he is not being truthful given his A1c.    Acute hypernatremia/hypokalemia: Initially when patient came in, sodium levels falsely normal appearing at 137 however when you account for his hyperglycemia, markedly elevated.  This is likely secondary to severe underlying volume depletion.  He is currently on half-normal saline plus replacement potassium  Anemia: Borderline macrocytic given MCV of 99.  Suspect is likely from alcohol abuse and chronic disease.  He is Hemoccult positive, but hemoglobin has not changed significantly since admission.    Colitis, with diarrhea: Checking C. difficile PCR.  Changed Rocephin and Zithromax to broader spectrum to cover anaerobes.    Polysubstance abuse [facility]): Patient reported also abuses alcohol.  Alcohol screen was negative when he came in.  Urine drug screen pending.    AKI (acute kidney injury) ([place]): Secondary to DKA.  Resolved with aggressive fluid resuscitation:   Code Status: Full code  Family Communication: Updated sister by phone  Disposition Plan: Continue in ICU, need to wean off of pressor support and stabilize blood pressure.   Consultants:  Critical care  Procedures:  PICC line placement 10/1  Pressor support started 9/30  Antimicrobials:  IV Rocephin and Zithromax 9/29-10/1  IV cefepime, Zyvox and Flagyl 10/1-present  DVT prophylaxis:  **Lovenox*  I have spent 35 minutes in the care of this critically ill patient including interpretation of labs, discussion of care with specialist, bedside examination and medical decision making.  Objective: Vitals:   03/18/20 1300  03/18/20 1303  BP: 114/73   Pulse: 67   Resp: (!) 25   Temp:  (!) 97.5 F (36.4 C)  SpO2: 100%     Intake/Output Summary (Last 24 hours) at 03/18/2020  1409 Last data filed at 03/18/2020 1300 Gross per 24 hour  Intake 5214.33 ml  Output 125 ml  Net 5089.33 ml   Filed Weights   03/16/20 1157  Weight: 72.6 kg   Body mass index is 22.96 kg/m.  Exam:   General: Alert and oriented x3, no acute distress  HEENT: Normocephalic atraumatic, mucous membranes remain dry  Neck: Supple, no JVD  Cardiovascular: Regular rate and rhythm, S1-S2  Respiratory: Scattered rails, breathing minimally labored  Abdomen: Soft, mild tenderness in the middle to left lower quadrants, nondistended, hypoactive bowel sounds  Musculoskeletal: No clubbing or cyanosis or edema  Skin: No skin breaks, tears or lesions  Neuro: No focal deficits  Psychiatry: Appropriate, no evidence of psychoses   Data Reviewed: CBC: Recent Labs  Lab 02/20/2020 2000 03/16/20 0049 03/17/20 1343 03/18/20 0529  WBC 16.6* 14.3* 16.1* 12.5*  NEUTROABS 12.1*  --   --   --   HGB 7.0* 8.7* 9.2* 8.5*  HCT 21.6* 26.0* 27.1* 25.0*  MCV 105.9* 101.2* 98.2 99.2  PLT 171 120* 120* 90*   Basic Metabolic Panel: Recent Labs  Lab 03/16/20 1049 03/16/20 1315 03/16/20 1928 03/17/20 0605 03/18/20 0529  NA 146* 147* 147* 151* 144  K 3.0* 3.2* 4.0 2.6* 3.6  CL 109 114* 112* 116* 115*  CO2 23 21* 23 22 20*  GLUCOSE 148* 115* 131* 132* 295*  BUN 21* _0 CREATININE 1.06 0.97 1.11 0.87 0.82  CALCIUM 7.7* 7.1* 7.9* 7.7* 7.2*   GFR: Estimated Creatinine Clearance: 98.4 mL/min (by C-G formula based on SCr of 0.82 mg/dL). Liver Function Tests: Recent Labs  Lab 03/12/2020 2000  AST 104*  ALT 60*  ALKPHOS 204*  BILITOT 1.7*  PROT 7.2  ALBUMIN 2.3*   No results for input(s): LIPASE, AMYLASE in the last 168 hours. Recent Labs  Lab 02/19/2020 2237  AMMONIA 45*   Coagulation Profile: Recent Labs  Lab 03/03/2020 2000  INR 1.4*   Cardiac Enzymes: No results for input(s): CKTOTAL, CKMB, CKMBINDEX, TROPONINI in the last 168 hours. BNP (last 3 results) No results for  input(s): PROBNP in the last 8760 hours. HbA1C: Recent Labs    03/16/20 0051  HGBA1C >15.5*   CBG: Recent Labs  Lab 03/17/20 1214 03/17/20 1612 03/17/20 2109 03/18/20 0734 03/18/20 1217  GLUCAP 104* 193* 178* 255* 152*   Lipid Profile: No results for input(s): CHOL, HDL, LDLCALC, TRIG, CHOLHDL, LDLDIRECT in the last 72 hours. Thyroid Function Tests: No results for input(s): TSH, T4TOTAL, FREET4, T3FREE, THYROIDAB in the last 72 hours. Anemia Panel: Recent Labs    03/01/2020 2000 03/16/20 0000  VITAMINB12  --  1,040*  FOLATE  --  >100.0  FERRITIN  --  1,990*  TIBC  --  113*  IRON  --  117  RETICCTPCT 1.5  --    Urine analysis:    Component Value Date/Time   COLORURINE YELLOW 03/16/2020 1559   APPEARANCEUR CLEAR 03/16/2020 1559   LABSPEC 1.038 (H) 03/16/2020 1559   PHURINE 7.0 03/16/2020 1559   GLUCOSEU 150 (A) 03/16/2020 1559   HGBUR SMALL (A) 03/16/2020 1559   BILIRUBINUR NEGATIVE 03/16/2020 1559   KETONESUR NEGATIVE 03/16/2020 1559   PROTEINUR NEGATIVE 03/16/2020 1559   NITRITE NEGATIVE 03/16/2020 1559  LEUKOCYTESUR NEGATIVE 03/16/2020 1559   Sepsis Labs: _0 (procalcitonin:4,lacticidven:4)  ) Recent Results (from the past 240 hour(s))  Culture, blood (Routine x 2)     Status: None (Preliminary result)   Collection Time: 02/28/2020  8:00 PM   Specimen: BLOOD  Result Value Ref Range Status   Specimen Description   Final    BLOOD RIGHT ANTECUBITAL Performed at [facility] [address] [place], [place] [address]    Special Requests   Final    BOTTLES DRAWN AEROBIC AND ANAEROBIC Blood Culture adequate volume Performed at [facility] [address], [place], [place] [address]    Culture   Final    NO GROWTH 3 DAYS Performed at [facility], [place] [address] [place], [place] [address]    Report Status PENDING  Incomplete  Culture, blood (Routine x 2)     Status: None (Preliminary result)    Collection Time: 03/14/2020  8:00 PM   Specimen: BLOOD  Result Value Ref Range Status   Specimen Description   Final    BLOOD RIGHT ANTECUBITAL Performed at [facility] [address] [place], [place] [address]    Special Requests   Final    BOTTLES DRAWN AEROBIC AND ANAEROBIC Blood Culture results may not be optimal due to an inadequate volume of blood received in culture bottles Performed at [facility] [address], [place], [place] [address]    Culture   Final    NO GROWTH 3 DAYS Performed at [facility], [place] [address], [place], [place] [address]    Report Status PENDING  Incomplete  Respiratory Panel by RT PCR (Flu A&B, Covid) - Nasopharyngeal Swab     Status: None   Collection Time: 02/18/2020  8:32 PM   Specimen: Nasopharyngeal Swab  Result Value Ref Range Status   SARS Coronavirus 2 by RT PCR NEGATIVE NEGATIVE Final    Comment: (NOTE) SARS-CoV-2 target nucleic acids are NOT DETECTED.  The SARS-CoV-2 RNA is generally detectable in upper respiratoy specimens during the acute phase of infection. The lowest concentration of SARS-CoV-2 viral copies this assay can detect is 131 copies/mL. A negative result does not preclude SARS-Cov-2 infection and should not be used as the sole basis for treatment or other patient management decisions. A negative result may occur with  improper specimen collection/handling, submission of specimen other than nasopharyngeal swab, presence of viral mutation(s) within the areas targeted by this assay, and inadequate number of viral copies (<131 copies/mL). A negative result must be combined with clinical observations, patient history, and epidemiological information. The expected result is Negative.  Fact Sheet for Patients:  PinkCheek.be  Fact Sheet for Healthcare Providers:  GravelBags.it  This test is no t yet approved or cleared by  the [place] FDA and  has been authorized for detection and/or diagnosis of SARS-CoV-2 by FDA under an Emergency Use Authorization (EUA). This EUA will remain  in effect (meaning this test can be used) for the duration of the COVID-19 declaration under Section 564(b)(1) of the Act, 21 U.S.C. section 360bbb-3(b)(1), unless the authorization is terminated or revoked sooner.     Influenza A by PCR NEGATIVE NEGATIVE Final   Influenza B by PCR NEGATIVE NEGATIVE Final    Comment: (NOTE) The Xpert Xpress SARS-CoV-2/FLU/RSV assay is intended as an aid in  the diagnosis of influenza from Nasopharyngeal swab specimens and  should not be used as a sole basis for treatment.  Nasal washings and  aspirates are unacceptable for Xpert Xpress SARS-CoV-2/FLU/RSV  testing.  Fact Sheet for Patients: PinkCheek.be  Fact Sheet for Healthcare Providers: GravelBags.it  This test is not yet approved or cleared by the [place] FDA and  has been authorized for detection and/or diagnosis of SARS-CoV-2 by  FDA under an Emergency Use Authorization (EUA). This EUA will remain  in effect (meaning this test can be used) for the duration of the  Covid-19 declaration under Section 564(b)(1) of the Act, 21  U.S.C. section 360bbb-3(b)(1), unless the authorization is  terminated or revoked. Performed at [facility], [place] [address] [place], [place] [address]   MRSA PCR Screening     Status: Abnormal   Collection Time: 03/16/20  9:29 AM   Specimen: Nasal Mucosa; Nasopharyngeal  Result Value Ref Range Status   MRSA by PCR POSITIVE (A) NEGATIVE Final    Comment:        The GeneXpert MRSA Assay (FDA approved for NASAL specimens only), is one component of a comprehensive MRSA colonization surveillance program. It is not intended to diagnose MRSA infection nor to guide or monitor treatment for MRSA infections. RESULT CALLED TO,  READ BACK BY AND VERIFIED WITH: WEST, S. RN _0  ON 9.30.2021 BY [facility] Performed at [facility] [address] [place], [place] [address]       Studies: No results found.  Scheduled Meds: . chlorhexidine  15 mL Mouth Rinse BID  . Chlorhexidine Gluconate Cloth  6 each Topical Q0600  . enoxaparin (LOVENOX) injection  40 mg Subcutaneous Q24H  . insulin aspart  0-5 Units Subcutaneous QHS  . insulin aspart  0-9 Units Subcutaneous TID WC  . insulin aspart  4 Units Subcutaneous TID WC  . insulin detemir  0.3 Units/kg Subcutaneous Q24H  . linezolid  600 mg Oral Q12H  . mouth rinse  15 mL Mouth Rinse q12n4p  . metroNIDAZOLE  500 mg Oral Q8H  . mupirocin ointment  1 application Nasal BID  . sodium chloride flush  10-40 mL Intracatheter Q12H  . thiamine  100 mg Oral Daily    Continuous Infusions: . sodium chloride    . ceFEPime (MAXIPIME) IV 2 g (03/18/20 1324)  . norepinephrine (LEVOPHED) Adult infusion 7 mcg/min (03/18/20 1300)  . sodium chloride 0.45 % with kcl 125 mL/hr at 03/18/20 1300     LOS: 2 days     Annita Brod, MD [facility]   03/18/2020, 2:09 PM

## 2020-03-18 NOTE — Progress Notes (Signed)
Inpatient Diabetes Program Recommendations  AACE/ADA: New Consensus Statement on Inpatient Glycemic Control  Target Ranges:  Prepandial:   less than 140 mg/dL      Peak postprandial:   less than 180 mg/dL (1-2 hours)      Critically ill patients:  140 - 180 mg/dL   Results for Roberto Rhodes, Roberto Rhodes (MRN [id number]) as of 03/18/2020 07:59  Ref. Range 03/17/2020 07:32 03/17/2020 12:14 03/17/2020 16:12 03/17/2020 21:09 03/18/2020 07:34  Glucose-Capillary Latest Ref Range: 70 - 99 mg/dL 409 (H) 811 (H) 914 (H) 178 (H) 255 (H)  Results for Roberto Rhodes, Roberto Rhodes (MRN [id number]) as of 03/18/2020 07:59  Ref. Range 03/16/2020 00:51  Hemoglobin A1C Latest Ref Range: 4.8 - 5.6 % >15.5 (H)  Results for Roberto Rhodes, Roberto Rhodes (MRN [id number]) as of 03/18/2020 07:59  Ref. Range Mar 17, 2020 20:00 03/16/2020 00:51  Beta-Hydroxybutyric Acid Latest Ref Range: 0.05 - 0.27 mmol/L 0.63 (H) 0.12   Results for Roberto Rhodes, Roberto Rhodes (MRN [id number]) as of 03/18/2020 07:59  Ref. Range 03-17-2020 20:00  CO2 Latest Ref Range: 22 - 32 mmol/L 21 (L)  Glucose Latest Ref Range: 70 - 99 mg/dL 2,440 (HH)  Anion gap Latest Ref Range: 5 - 15  20 (H)   Review of Glycemic Control  Diabetes history: DM2 Outpatient Diabetes medications: Lantus 35 units QHS, Humalog 4-12 units TID with meals, Glipizide 5 mg QAM, Metformin 750 mg daily Current orders for Inpatient glycemic control: Levemir 22 units Q24H, Novolog 0-9 units TID with meals, Novolog 0-5 units QHS, Novolog 4 units TID with meals  Inpatient Diabetes Program Recommendations:     Insulin: Please consider increasing Levemir to 24 units Q24H.  HbgA1C:  A1C is >15.5% on 03/16/20 indicating an average glucose of >398 mg/dl over the past 2-3 months.  NOTE: Noted consult for Diabetes Coordinator. Diabetes Coordinator is not on campus over the weekend but available by pager from 8am to 5pm for questions or concerns. In reviewing chart, noted patient was hospitalized at [facility] [place] 06/27/19-07/02/19 for DKA and c-diff. Per  discharge summary patient was to get medication from charity care and was prescribed Lantus 30 units daily, Humalog 2-12 units QID, Metformin 750 mg daily, and Glipizide 5 mg daily. Patient had hospital follow up with [facility]. [facility] on 07/15/19 (with [facility]) and per note patient was approved for pharmacy assistance from January 2022 and was continued on Lantus, Humalog, and Metformin but Glipizide was stopped. No follow up visit noted in Care Everywhere since then. Noted consult for TOC. Will need to ensure patient has follow up arranged and if medication assistance is needed.  Will continue to follow along while inpatient.  Thanks, Orlando Penner, RN, MSN, CDE Diabetes Coordinator Inpatient Diabetes Program [phone] (Team Pager from 8am to 5pm)

## 2020-03-19 ENCOUNTER — Inpatient Hospital Stay (HOSPITAL_COMMUNITY): Payer: Self-pay

## 2020-03-19 DIAGNOSIS — E1165 Type 2 diabetes mellitus with hyperglycemia: Secondary | ICD-10-CM

## 2020-03-19 LAB — COMPREHENSIVE METABOLIC PANEL
ALT: 62 U/L — ABNORMAL HIGH (ref 0–44)
AST: 157 U/L — ABNORMAL HIGH (ref 15–41)
Albumin: 1.6 g/dL — ABNORMAL LOW (ref 3.5–5.0)
Alkaline Phosphatase: 145 U/L — ABNORMAL HIGH (ref 38–126)
Anion gap: 8 (ref 5–15)
BUN: 9 mg/dL (ref 6–20)
CO2: 18 mmol/L — ABNORMAL LOW (ref 22–32)
Calcium: 7.3 mg/dL — ABNORMAL LOW (ref 8.9–10.3)
Chloride: 116 mmol/L — ABNORMAL HIGH (ref 98–111)
Creatinine, Ser: 0.82 mg/dL (ref 0.61–1.24)
GFR calc Af Amer: >60 mL/min (ref >60)
GFR calc non Af Amer: >60 mL/min (ref >60)
Glucose, Bld: 116 mg/dL — ABNORMAL HIGH (ref 70–99)
Potassium: 3.8 mmol/L (ref 3.5–5.1)
Sodium: 142 mmol/L (ref 135–145)
Total Bilirubin: 2 mg/dL — ABNORMAL HIGH (ref 0.3–1.2)
Total Protein: 5.6 g/dL — ABNORMAL LOW (ref 6.5–8.1)

## 2020-03-19 LAB — GLUCOSE, CAPILLARY
Glucose-Capillary: 116 mg/dL — ABNORMAL HIGH (ref 70–99)
Glucose-Capillary: 155 mg/dL — ABNORMAL HIGH (ref 70–99)
Glucose-Capillary: 169 mg/dL — ABNORMAL HIGH (ref 70–99)
Glucose-Capillary: 127 mg/dL — ABNORMAL HIGH (ref 70–99)
Glucose-Capillary: 45 mg/dL — ABNORMAL LOW (ref 70–99)
Glucose-Capillary: 49 mg/dL — ABNORMAL LOW (ref 70–99)

## 2020-03-19 LAB — CBC
HCT: 26.3 % — ABNORMAL LOW (ref 39.0–52.0)
Hemoglobin: 8.9 g/dL — ABNORMAL LOW (ref 13.0–17.0)
MCH: 33.7 pg (ref 26.0–34.0)
MCHC: 33.8 g/dL (ref 30.0–36.0)
MCV: 99.6 fL (ref 80.0–100.0)
Platelets: 89 10*3/uL — ABNORMAL LOW (ref 150–400)
RBC: 2.64 MIL/uL — ABNORMAL LOW (ref 4.22–5.81)
RDW: 15.2 % (ref 11.5–15.5)
WBC: 11.5 10*3/uL — ABNORMAL HIGH (ref 4.0–10.5)

## 2020-03-19 LAB — PROCALCITONIN: Procalcitonin: 0.74 ng/mL

## 2020-03-19 LAB — C DIFFICILE (CDIFF) QUICK SCRN (NO PCR REFLEX)
C Diff antigen: NEGATIVE
C Diff interpretation: NOT DETECTED
C Diff toxin: NEGATIVE

## 2020-03-19 LAB — LACTIC ACID, PLASMA: Lactic Acid, Venous: 2.8 mmol/L (ref 0.5–1.9)

## 2020-03-19 MED ORDER — IOHEXOL 9 MG/ML PO SOLN
ORAL | Status: AC
  Filled 2020-03-19: qty 1000

## 2020-03-19 MED ORDER — IOHEXOL 9 MG/ML PO SOLN
500.0000 mL | ORAL | Status: AC
  Administered 2020-03-19 (×2): 500 mL via ORAL

## 2020-03-19 MED ORDER — IOHEXOL 300 MG/ML  SOLN
100.0000 mL | Freq: Once | INTRAMUSCULAR | Status: AC | PRN
  Administered 2020-03-19: 100 mL via INTRAVENOUS

## 2020-03-19 NOTE — Progress Notes (Addendum)
 PROGRESS NOTE  Roberto Rhodes MRN:[id number] DOB: 10/19/1960 DOA: 03/06/2020 PCP: Pcp, No  HPI/Recap of past 24 hours: 59-year-old male with reported history of polysubstance abuse in the past as well as hypertension and diabetes mellitus, noncompliant with medications who was admitted on 9/29 and septic shock and secondary DKA.  Patient was found to have a lactic acid level of 6.4.  Source of infection felt to be multi lobar pneumonia.  He was also noted to have some diarrhea and a CT scan of the abdomen noted sigmoid colitis.  Despite aggressive fluid resuscitation, blood pressure still remained low and he was placed on pressor support.  Patient started on IV fluids and antibiotics.  Patient's DKA has since resolved and CBGs have stabilized and insulin drip able to be weaned off.  However, he remains on pressor support as he is still intravascularly volume depleted.  Continue to get aggressive IV fluids, broad-spectrum antibiotics.    Over the past few days, lactic acid level has trended downward, but seems to have leveled off around 2.8.  Procalcitonin also trending downward, but has stayed about the same. . At 0.74.  Patient still requiring pressor support.  No further fevers although he does complain of continued mild abdominal pain.  This morning, his abdomen is more distended.  Nursing unable to get stool sample which has been sent for C. difficile.  He remains on pressor support.  A stat CT scan of the abdomen pelvis has been ordered.  Assessment/Plan: Principal Problem:   Septic shock (HCC) secondary to multifocal pneumonia: Patient met criteria for sepsis on admission given tachycardia, tachypnea, marked leukocytosis and hypotension not relieved with IV fluids requiring pressor support plus a lactic acid level of 6.9.  Continue broad-spectrum antibiotics.  Initial white blood cell count, procalcitonin and lactic acid level have all improved but procalcitonin and lactic acid seem to have plateaued.   With worsening abdomen, and concern that colitis may be worsening?  Waiting for C. difficile cultures.  Have reordered CT scan of the abdomen pelvis stat.  Continue pressor support.    Addendum: Reviewed CT results this evening.  Colitis now has extended up to hepatic flexure, previously at rectosigmoid area.  Curious since it appears infection not being treated with Zosyn, yet CDiff ruled out.  Could be ischemic.  Will discuss with ID.  Ideally, patient could get colonoscopy with biopsy, but doubtful GI would do so while patient still needing pressor support.  Active Problems:    DKA (diabetic ketoacidoses) with underlying uncontrolled diabetes mellitus: Caused by multifocal pneumonia but also severe underlying uncontrolled diabetes mellitus.Patient sister confirms that patient has been noncompliant with his medications.  A1c greater than 15.5. (Indicating average blood sugar of 398) currently he is on sliding scale and Levemir.  Seen by diabetes educator who is recommending increasing his Levemir based off of morning blood sugar on 10/2 at 255.  However, since then without making that change, blood sugars have been a bit lower.  We will hold off until his blood sugars start to trend upward.  When asked if he takes his insulin, patient tells me he takes it every day, however I feel like he is not being truthful given his A1c.    Acute hypernatremia/hypokalemia: Initially when patient came in, sodium levels falsely normal appearing at 137 however when you account for his hyperglycemia, markedly elevated.  This is likely secondary to severe underlying volume depletion.  He is currently on half-normal saline plus replacement potassium  Anemia:   Borderline macrocytic given MCV of 99.  Suspect is likely from alcohol abuse and chronic disease.  He is Hemoccult positive, but hemoglobin has not changed significantly since admission.    Colitis, with diarrhea: Checking C. difficile PCR.  Changed Rocephin and  Zithromax to broader spectrum to cover anaerobes.    Polysubstance abuse [facility]): Patient reported also abuses alcohol.  Alcohol screen was negative when he came in.  Urine drug screen pending.  Hope to have these results on Monday 10/4    AKI (acute kidney injury) [facility]): Secondary to DKA.  Resolved with aggressive fluid resuscitation:   Code Status: Full code  Family Communication: Updated sister by phone  Disposition Plan: Continue in ICU, need to wean off of pressor support and stabilize blood pressure.   Consultants:  Critical care  Procedures:  PICC line placement 10/1  Pressor support started 9/30  Antimicrobials:  IV Rocephin and Zithromax 9/29-10/1  IV cefepime, Zyvox and Flagyl 10/1-present  DVT prophylaxis:  **Lovenox*  I have spent 40 minutes in the care of this critically ill patient including interpretation of labs, discussion of care with specialist, bedside examination and medical decision making.  Objective: Vitals:   03/19/20 0900 03/19/20 1000  BP: (!) 127/91 (!) 88/58  Pulse: 69 (!) 107  Resp: (!) 25 (!) 22  Temp:    SpO2: 100% (!) 88%    Intake/Output Summary (Last 24 hours) at 03/19/2020 1131 Last data filed at 03/19/2020 0900 Gross per 24 hour  Intake 3495.71 ml  Output 975 ml  Net 2520.71 ml   Filed Weights   03/16/20 1157  Weight: 72.6 kg   Body mass index is 22.96 kg/m.  Exam:   General: Alert and oriented x3, no acute distress, fatigued  HEENT: Normocephalic atraumatic, mucous membranes remain dry  Neck: Supple, no JVD  Cardiovascular: Regular rate and rhythm, S1-S2  Respiratory: Scattered rails, breathing minimally labored  Abdomen: Less soft, mild tenderness in the middle to left lower quadrants, increased distention, few bowel sounds  Musculoskeletal: No clubbing or cyanosis.  Trace pitting edema  Skin: No skin breaks, tears or lesions  Neuro: No focal deficits  Psychiatry: Appropriate, no evidence of  psychoses   Data Reviewed: CBC: Recent Labs  Lab 02/29/2020 2000 03/16/20 0049 03/17/20 1343 03/18/20 0529 03/19/20 0500  WBC 16.6* 14.3* 16.1* 12.5* 11.5*  NEUTROABS 12.1*  --   --   --   --   HGB 7.0* 8.7* 9.2* 8.5* 8.9*  HCT 21.6* 26.0* 27.1* 25.0* 26.3*  MCV 105.9* 101.2* 98.2 99.2 99.6  PLT 171 120* 120* 90* 89*   Basic Metabolic Panel: Recent Labs  Lab 03/16/20 1315 03/16/20 1928 03/17/20 0605 03/18/20 0529 03/19/20 0500  NA 147* 147* 151* 144 142  K 3.2* 4.0 2.6* 3.6 3.8  CL 114* 112* 116* 115* 116*  CO2 21* 23 22 20* 18*  GLUCOSE 115* 131* 132* 295* 116*  BUN _0 CREATININE 0.97 1.11 0.87 0.82 0.82  CALCIUM 7.1* 7.9* 7.7* 7.2* 7.3*   GFR: Estimated Creatinine Clearance: 98.4 mL/min (by C-G formula based on SCr of 0.82 mg/dL). Liver Function Tests: Recent Labs  Lab 02/19/2020 2000 03/19/20 0500  AST 104* 157*  ALT 60* 62*  ALKPHOS 204* 145*  BILITOT 1.7* 2.0*  PROT 7.2 5.6*  ALBUMIN 2.3* 1.6*   No results for input(s): LIPASE, AMYLASE in the last 168 hours. Recent Labs  Lab 02/27/2020 2237  AMMONIA 45*   Coagulation Profile: Recent Labs  Lab 03/04/2020 2000  INR 1.4*   Cardiac Enzymes: No results for input(s): CKTOTAL, CKMB, CKMBINDEX, TROPONINI in the last 168 hours. BNP (last 3 results) No results for input(s): PROBNP in the last 8760 hours. HbA1C: No results for input(s): HGBA1C in the last 72 hours. CBG: Recent Labs  Lab 03/18/20 0734 03/18/20 1217 03/18/20 1557 03/18/20 2209 03/19/20 0725  GLUCAP 255* 152* 164* 107* 116*   Lipid Profile: No results for input(s): CHOL, HDL, LDLCALC, TRIG, CHOLHDL, LDLDIRECT in the last 72 hours. Thyroid Function Tests: No results for input(s): TSH, T4TOTAL, FREET4, T3FREE, THYROIDAB in the last 72 hours. Anemia Panel: No results for input(s): VITAMINB12, FOLATE, FERRITIN, TIBC, IRON, RETICCTPCT in the last 72 hours. Urine analysis:    Component Value Date/Time   COLORURINE YELLOW  03/16/2020 1559   APPEARANCEUR CLEAR 03/16/2020 1559   LABSPEC 1.038 (H) 03/16/2020 1559   PHURINE 7.0 03/16/2020 1559   GLUCOSEU 150 (A) 03/16/2020 1559   HGBUR SMALL (A) 03/16/2020 1559   BILIRUBINUR NEGATIVE 03/16/2020 1559   KETONESUR NEGATIVE 03/16/2020 1559   PROTEINUR NEGATIVE 03/16/2020 1559   NITRITE NEGATIVE 03/16/2020 1559   LEUKOCYTESUR NEGATIVE 03/16/2020 1559   Sepsis Labs: _0 (procalcitonin:4,lacticidven:4)  ) Recent Results (from the past 240 hour(s))  Culture, blood (Routine x 2)     Status: None (Preliminary result)   Collection Time: 02/21/2020  8:00 PM   Specimen: BLOOD  Result Value Ref Range Status   Specimen Description   Final    BLOOD RIGHT ANTECUBITAL Performed at [facility], [place] [address], [place], [place] [address]    Special Requests   Final    BOTTLES DRAWN AEROBIC AND ANAEROBIC Blood Culture adequate volume Performed at [place] [address], [place], [place] [address]    Culture   Final    NO GROWTH 4 DAYS Performed at [facility], [place] [address], [place], [place] [address]    Report Status PENDING  Incomplete  Culture, blood (Routine x 2)     Status: None (Preliminary result)   Collection Time: 03/14/2020  8:00 PM   Specimen: BLOOD  Result Value Ref Range Status   Specimen Description   Final    BLOOD RIGHT ANTECUBITAL Performed at [place] [address], [place], [place] [address]    Special Requests   Final    BOTTLES DRAWN AEROBIC AND ANAEROBIC Blood Culture results may not be optimal due to an inadequate volume of blood received in culture bottles Performed at [place] [address], [place], [place] [address]    Culture   Final    NO GROWTH 4 DAYS Performed at [facility], [place] [address] [place], [place] [address]    Report Status PENDING  Incomplete  Respiratory Panel by RT PCR (Flu A&B, Covid) - Nasopharyngeal  Swab     Status: None   Collection Time: 03/09/2020  8:32 PM   Specimen: Nasopharyngeal Swab  Result Value Ref Range Status   SARS Coronavirus 2 by RT PCR NEGATIVE NEGATIVE Final    Comment: (NOTE) SARS-CoV-2 target nucleic acids are NOT DETECTED.  The SARS-CoV-2 RNA is generally detectable in upper respiratoy specimens during the acute phase of infection. The lowest concentration of SARS-CoV-2 viral copies this assay can detect is 131 copies/mL. A negative result does not preclude SARS-Cov-2 infection and should not be used as the sole basis for treatment or other patient management decisions. A negative result  may occur with  improper specimen collection/handling, submission of specimen other than nasopharyngeal swab, presence of viral mutation(s) within the areas targeted by this assay, and inadequate number of viral copies (<131 copies/mL). A negative result must be combined with clinical observations, patient history, and epidemiological information. The expected result is Negative.  Fact Sheet for Patients:  https://www.fda.gov/media/142436/download  Fact Sheet for Healthcare Providers:  https://www.fda.gov/media/142435/download  This test is no t yet approved or cleared by the [place] FDA and  has been authorized for detection and/or diagnosis of SARS-CoV-2 by FDA under an Emergency Use Authorization (EUA). This EUA will remain  in effect (meaning this test can be used) for the duration of the COVID-19 declaration under Section 564(b)(1) of the Act, 21 U.S.C. section 360bbb-3(b)(1), unless the authorization is terminated or revoked sooner.     Influenza A by PCR NEGATIVE NEGATIVE Final   Influenza B by PCR NEGATIVE NEGATIVE Final    Comment: (NOTE) The Xpert Xpress SARS-CoV-2/FLU/RSV assay is intended as an aid in  the diagnosis of influenza from Nasopharyngeal swab specimens and  should not be used as a sole basis for treatment. Nasal washings and  aspirates are  unacceptable for Xpert Xpress SARS-CoV-2/FLU/RSV  testing.  Fact Sheet for Patients: https://www.fda.gov/media/142436/download  Fact Sheet for Healthcare Providers: https://www.fda.gov/media/142435/download  This test is not yet approved or cleared by the [place] FDA and  has been authorized for detection and/or diagnosis of SARS-CoV-2 by  FDA under an Emergency Use Authorization (EUA). This EUA will remain  in effect (meaning this test can be used) for the duration of the  Covid-19 declaration under Section 564(b)(1) of the Act, 21  U.S.C. section 360bbb-3(b)(1), unless the authorization is  terminated or revoked. Performed at [facility], [address] [place], [place] [address]   MRSA PCR Screening     Status: Abnormal   Collection Time: 03/16/20  9:29 AM   Specimen: Nasal Mucosa; Nasopharyngeal  Result Value Ref Range Status   MRSA by PCR POSITIVE (A) NEGATIVE Final    Comment:        The GeneXpert MRSA Assay (FDA approved for NASAL specimens only), is one component of a comprehensive MRSA colonization surveillance program. It is not intended to diagnose MRSA infection nor to guide or monitor treatment for MRSA infections. RESULT CALLED TO, READ BACK BY AND VERIFIED WITH: WEST, S. RN @1117 ON 9.30.2021 BY NMCCOY Performed at [facility], [address] [place],  [address]       Studies: No results found.  Scheduled Meds: . iohexol      . chlorhexidine  15 mL Mouth Rinse BID  . Chlorhexidine Gluconate Cloth  6 each Topical Q0600  . enoxaparin (LOVENOX) injection  40 mg Subcutaneous Q24H  . insulin aspart  0-5 Units Subcutaneous QHS  . insulin aspart  0-9 Units Subcutaneous TID WC  . insulin aspart  4 Units Subcutaneous TID WC  . insulin detemir  0.3 Units/kg Subcutaneous Q24H  . linezolid  600 mg Oral Q12H  . metroNIDAZOLE  500 mg Oral Q8H  . mupirocin ointment  1 application Nasal BID  . sodium  chloride flush  10-40 mL Intracatheter Q12H  . thiamine  100 mg Oral Daily    Continuous Infusions: . sodium chloride    . ceFEPime (MAXIPIME) IV Stopped (03/19/20 0543)  . norepinephrine (LEVOPHED) Adult infusion 4 mcg/min (03/19/20 1003)  . sodium chloride 0.45 % with kcl 125 mL/hr at 03/19/20 0900       LOS: 3 days     Annita Brod, MD [facility]   03/19/2020, 11:31 AM

## 2020-03-20 ENCOUNTER — Inpatient Hospital Stay (HOSPITAL_COMMUNITY): Payer: Self-pay

## 2020-03-20 LAB — GLUCOSE, CAPILLARY
Glucose-Capillary: 54 mg/dL — ABNORMAL LOW (ref 70–99)
Glucose-Capillary: 60 mg/dL — ABNORMAL LOW (ref 70–99)
Glucose-Capillary: 77 mg/dL (ref 70–99)
Glucose-Capillary: 91 mg/dL (ref 70–99)
Glucose-Capillary: 174 mg/dL — ABNORMAL HIGH (ref 70–99)
Glucose-Capillary: 143 mg/dL — ABNORMAL HIGH (ref 70–99)

## 2020-03-20 LAB — CULTURE, BLOOD (ROUTINE X 2)
Culture: NO GROWTH
Culture: NO GROWTH
Special Requests: ADEQUATE

## 2020-03-20 LAB — COMPREHENSIVE METABOLIC PANEL WITH GFR
ALT: 60 U/L — ABNORMAL HIGH (ref 0–44)
AST: 158 U/L — ABNORMAL HIGH (ref 15–41)
Albumin: 1.6 g/dL — ABNORMAL LOW (ref 3.5–5.0)
Alkaline Phosphatase: 150 U/L — ABNORMAL HIGH (ref 38–126)
Anion gap: 6 (ref 5–15)
BUN: 7 mg/dL (ref 6–20)
CO2: 17 mmol/L — ABNORMAL LOW (ref 22–32)
Calcium: 7.6 mg/dL — ABNORMAL LOW (ref 8.9–10.3)
Chloride: 114 mmol/L — ABNORMAL HIGH (ref 98–111)
Creatinine, Ser: 0.67 mg/dL (ref 0.61–1.24)
GFR calc Af Amer: >60 mL/min
GFR calc non Af Amer: >60 mL/min
Glucose, Bld: 63 mg/dL — ABNORMAL LOW (ref 70–99)
Potassium: 4.5 mmol/L (ref 3.5–5.1)
Sodium: 137 mmol/L (ref 135–145)
Total Bilirubin: 2.2 mg/dL — ABNORMAL HIGH (ref 0.3–1.2)
Total Protein: 5.7 g/dL — ABNORMAL LOW (ref 6.5–8.1)

## 2020-03-20 LAB — PHOSPHORUS: Phosphorus: <1 mg/dL — CL (ref 2.5–4.6)

## 2020-03-20 LAB — PROCALCITONIN: Procalcitonin: 1.07 ng/mL

## 2020-03-20 LAB — CBC
HCT: 27.6 % — ABNORMAL LOW (ref 39.0–52.0)
Hemoglobin: 9.3 g/dL — ABNORMAL LOW (ref 13.0–17.0)
MCH: 33.8 pg (ref 26.0–34.0)
MCHC: 33.7 g/dL (ref 30.0–36.0)
MCV: 100.4 fL — ABNORMAL HIGH (ref 80.0–100.0)
Platelets: 93 10*3/uL — ABNORMAL LOW (ref 150–400)
RBC: 2.75 MIL/uL — ABNORMAL LOW (ref 4.22–5.81)
RDW: 16.2 % — ABNORMAL HIGH (ref 11.5–15.5)
WBC: 12.8 10*3/uL — ABNORMAL HIGH (ref 4.0–10.5)
nRBC: 1.3 % — ABNORMAL HIGH (ref 0.0–0.2)

## 2020-03-20 LAB — LACTIC ACID, PLASMA: Lactic Acid, Venous: 1.9 mmol/L (ref 0.5–1.9)

## 2020-03-20 LAB — MAGNESIUM: Magnesium: 1.4 mg/dL — ABNORMAL LOW (ref 1.7–2.4)

## 2020-03-20 MED ORDER — CHOLESTYRAMINE LIGHT 4 G PO PACK
4.0000 g | PACK | Freq: Three times a day (TID) | ORAL | Status: DC
  Administered 2020-03-20 – 2020-03-21 (×5): 4 g via ORAL
  Filled 2020-03-20 (×7): qty 1

## 2020-03-20 MED ORDER — GERHARDT'S BUTT CREAM
TOPICAL_CREAM | Freq: Two times a day (BID) | CUTANEOUS | Status: DC
  Administered 2020-03-24: 1 via TOPICAL
  Filled 2020-03-20: qty 1

## 2020-03-20 MED ORDER — SODIUM PHOSPHATES 45 MMOLE/15ML IV SOLN
30.0000 mmol | Freq: Once | INTRAVENOUS | Status: AC
  Administered 2020-03-20: 30 mmol via INTRAVENOUS
  Filled 2020-03-20: qty 10

## 2020-03-20 MED ORDER — SODIUM CHLORIDE 0.9 % IV SOLN
2.0000 g | INTRAVENOUS | Status: DC
  Administered 2020-03-20 – 2020-03-23 (×4): 2 g via INTRAVENOUS
  Filled 2020-03-20 (×4): qty 20

## 2020-03-20 MED ORDER — CHLORHEXIDINE GLUCONATE CLOTH 2 % EX PADS
6.0000 | MEDICATED_PAD | Freq: Every day | CUTANEOUS | Status: DC
  Administered 2020-03-20 – 2020-03-24 (×6): 6 via TOPICAL

## 2020-03-20 MED ORDER — MAGNESIUM SULFATE 2 GM/50ML IV SOLN
2.0000 g | Freq: Once | INTRAVENOUS | Status: AC
  Administered 2020-03-20: 2 g via INTRAVENOUS
  Filled 2020-03-20: qty 50

## 2020-03-20 MED ORDER — MORPHINE SULFATE (PF) 2 MG/ML IV SOLN
1.0000 mg | INTRAVENOUS | Status: DC | PRN
  Administered 2020-03-21 – 2020-03-22 (×4): 1 mg via INTRAVENOUS
  Filled 2020-03-20 (×4): qty 1

## 2020-03-20 MED ORDER — INSULIN ASPART 100 UNIT/ML ~~LOC~~ SOLN
2.0000 [IU] | Freq: Three times a day (TID) | SUBCUTANEOUS | Status: DC
  Administered 2020-03-20 – 2020-03-23 (×9): 2 [IU] via SUBCUTANEOUS

## 2020-03-20 MED ORDER — INSULIN DETEMIR 100 UNIT/ML ~~LOC~~ SOLN
15.0000 [IU] | SUBCUTANEOUS | Status: DC
  Administered 2020-03-21 – 2020-03-23 (×3): 15 [IU] via SUBCUTANEOUS
  Filled 2020-03-20 (×3): qty 0.15

## 2020-03-20 MED ORDER — K PHOS MONO-SOD PHOS DI & MONO 155-852-130 MG PO TABS
500.0000 mg | ORAL_TABLET | Freq: Two times a day (BID) | ORAL | Status: DC
  Administered 2020-03-20 – 2020-03-23 (×7): 500 mg via ORAL
  Filled 2020-03-20 (×8): qty 2

## 2020-03-20 NOTE — Progress Notes (Signed)
CRITICAL VALUE ALERT  Critical Value:  Phosphorus <1 Magnesium 1.4  Date & Time Notied:  03/20/20 1600  Provider Notified: Dahal   Orders Received/Actions taken: Awaiting new orders

## 2020-03-20 NOTE — Progress Notes (Signed)
PROGRESS NOTE  Roberto ParrDan Rhodes  DOB: 01/16/1961  PCP: Aviva KluverPcp, No ZOX:[id number]  DOA: 02/20/2020  LOS: 4 days   Chief Complaint  Patient presents with   Weakness   hyperglycemic   Brief narrative: Patient is 59 year old male with PMH significant for diabetes mellitus type 2,, HTN polysubstance abuse, noncompliance with medications.  9/30, patient was brought to the ED from home with extreme weakness. He had generalized weakness for 3 to 4 days and intermittent diarrhea for 3 weeks.  In the ED, his blood pressure dropped less than 90s. Labs showed significant hyperglycemia to more than 1000, mildly elevated b-hydroxybutyrate, elevated anion gap but normal bicarbonate and normal pH.  Calculated serum osmolality was high at 350. Corrected sodium level was 153, creatinine 1.75.  Lactic acid level was elevated to 6.4 Chest x-ray showed mild right infrahilar atelectasis and/or infiltrate.   CT chest showed bibasilar consolidation consistent with multifocal pneumonia. C abdomen pelvis showed wall thickening in the rectosigmoid in a circumferential fashion with surrounding inflammatory change consistent with focal colitis. He was admitted to ICU in a state of septic shock.  He was started on broad-spectrum antibiotics, aggressive IV hydration, IV insulin drip and electrolyte replacements.  Septic shock parameters as well as lactic acid level gradually improved.  Patient is off pressors since 10/4. Glucose level have stabilized on insulin drip and is currently on subcutaneous insulin.  However, patient continues to have persistent liquid bowel movement. 10/3, repeat CT scan of abdomen and pelvis showed progressive infectious/inflammatory colitis, now particularly involving the hepatic flexure.   Subjective: Patient was seen and examined this morning. Middle-aged Caucasian male.  Propped up in bed.  Looks lethargic. Has Flexi-Seal with more than 1 L of liquid output last 12 hours. Patient has  gurgling throat sounds but chest auscultation sounds clear.  Assessment/Plan: Septic shock - POA -secondary to multifocal pneumonia and colitis.  Management of individual issues as below.  -Currently on IV cefepime, IV Flagyl and oral Zyvox. -WBC, lactic acid and procalcitonin level improving, trend as below. -We will de-escalate antibiotics to IV Rocephin and IV Flagyl. Recent Labs  Lab 03/09/2020 2000 03/16/20 0049 03/17/20 0948 03/17/20 1343 03/17/20 1741 03/17/20 2231 03/18/20 0528 03/18/20 0529 03/18/20 0741 03/18/20 1310 03/19/20 0500 03/20/20 0604  WBC  --  14.3*  --  16.1*  --   --   --  12.5*  --   --  11.5* 12.8*  LATICACIDVEN   < >  --  4.0*  --    < > 4.0* 3.1*  --   --  2.7* 2.8* 1.9  PROCALCITON  --   --  0.94  --   --   --   --  0.81 0.79  --  0.74 1.07   < > = values in this interval not displayed.   Multifocal pneumonia -CT chest on admission showed bibasilar consolidation consistent with multifocal pneumonia. -Antibiotics as stated above. -Not on supplemental oxygen.  Acute colitis -Per history, patient had intermittent diarrhea for 3 weeks. -CT abdomen on admission showed colitis of the rectosigmoid area. -C. difficile negative. -Despite IV antibiotics, patient continued to have persistent liquid bowel movement.  -10/3, repeat CT scan of abdomen and pelvis showed progressive infectious/inflammatory colitis, now particularly involving the hepatic flexure.  -Currently patient has Flexi-Seal with more than 1 L of liquid output last 12 hours. -Send GI pathogen panel.  I will start the patient on Questran TID as well. -May need GI consultation if no improvement in  next 24 hours.  Hyperosmolar hyperglycemic state Mild ketosis DKA - ruled out -Labs on admission showed significant hyperglycemia to more than 1000, mildly elevated b-hydroxybutyrate, elevated anion gap but normal bicarbonate and normal pH.  Calculated serum osmolality was high at 350. -Patient was  admitted on insulin drip.  Adequately hydrated.  Eventually switched to subcutaneous insulin.  Type 2 diabetes mellitus -A1c more than 15.5 on 9/30 -Currently on subcutaneous insulin.  Diabetes care coordinator consult appreciated.   -Patient had hypoglycemic episodes this morning.   -We will reduce Levemir to 15 units daily and NovoLog 2 units 3 times daily Premeal.  Continue sliding scale insulin with checks.   Recent Labs  Lab 03/19/20 2304 03/20/20 0749 03/20/20 0825 03/20/20 0926 03/20/20 1245  GLUCAP 127* 54* 60* 77 91   AKI -Improved with hydration. Recent Labs    02/26/2020 2237 03/16/20 0049 03/16/20 0530 03/16/20 1049 03/16/20 1315 03/16/20 1928 03/17/20 0605 03/18/20 0529 03/19/20 0500 03/20/20 0604  BUN 28* 25* 22* 21* 18 16 11 9 9 7   CREATININE 1.50* 1.37* 1.20 1.06 0.97 1.11 0.87 0.82 0.82 0.67   Acute hypernatremia  -Corrected sodium level on admission was severely high at 153. -Adequately hydrated.  Continue to monitor. Recent Labs  Lab 02/27/2020 2237 03/16/20 0049 03/16/20 0530 03/16/20 1049 03/16/20 1315 03/16/20 1928 03/17/20 0605 03/18/20 0529 03/19/20 0500 03/20/20 0604  NA 137 149* 150* 146* 147* 147* 151* 144 142 137   Hypokalemia -Improved with replacement.  Continue to monitor electrolytes. Recent Labs  Lab 03/16/20 1928 03/17/20 0605 03/18/20 0529 03/19/20 0500 03/20/20 0604  K 4.0 2.6* 3.6 3.8 4.5   Elevated liver enzymes -Likely related to chronic alcoholism and septic shock.  No evidence of liver cirrhosis in imaging. Recent Labs  Lab 02/18/2020 2000 03/19/20 0500 03/20/20 0604  AST 104* 157* 158*  ALT 60* 62* 60*  ALKPHOS 204* 145* 150*  BILITOT 1.7* 2.0* 2.2*  PROT 7.2 5.6* 5.7*  ALBUMIN 2.3* 1.6* 1.6*   Hypoalbuminemia Malnutrition -Albumin level low at 1.6.  Likely because of third spacing evidenced as pedal edema as well as pleural effusion.  -Nutrition consulted.  Chronic macrocytic anemia -Macrocytosis  likely due to alcoholism. -Hemoccult positive but hemoglobin remained stable. -Ferritin level elevated to almost 2000.  Vitamin B12 1 folate level elevated as well. Recent Labs    03/13/2020 2000 03/02/2020 2000 03/16/20 0000 03/16/20 0049 03/16/20 0049 03/17/20 1343 03/17/20 1343 03/18/20 0529 03/18/20 0529 03/19/20 0500 03/20/20 0604  HGB 7.0*   < >  --  8.7*  --  9.2*  --  8.5*  --  8.9* 9.3*  MCV 105.9*   < >  --  101.2*   < > 98.2   < > 99.2   < > 99.6 100.4*  VITAMINB12  --   --  1,040*  --   --   --   --   --   --   --   --   FOLATE  --   --  >100.0  --   --   --   --   --   --   --   --   FERRITIN  --   --  1,990*  --   --   --   --   --   --   --   --   TIBC  --   --  113*  --   --   --   --   --   --   --   --  IRON  --   --  117  --   --   --   --   --   --   --   --   RETICCTPCT 1.5  --   --   --   --   --   --   --   --   --   --    < > = values in this interval not displayed.   Polysubstance abuse Chronic alcoholism -Needs to quit.  Counseled  Mobility: Encourage ambulation.  PT evaluation Code Status:   Code Status: Full Code  Nutritional status: Body mass index is 22.96 kg/m.     Diet Order            Diet Carb Modified Fluid consistency: Thin; Room service appropriate? Yes  Diet effective now                 DVT prophylaxis: enoxaparin (LOVENOX) injection 40 mg Start: 03/16/20 1000 SCDs Start: 03/16/20 0050   Antimicrobials:  IV Rocephin/IV Flagyl Fluid: Reduce normal saline to 75 mill per hour Consultants: GI consultation called. Family Communication:  None at bedside  Status is: Inpatient  Remains inpatient appropriate because:Persistent severe electrolyte disturbances and IV treatments appropriate due to intensity of illness or inability to take PO   Dispo: The patient is from: Home              Anticipated d/c is to: Home  Vs SNF              Anticipated d/c date is: > 3 days              Patient currently is not medically stable to  d/c.       Infusions:   sodium chloride     ceFEPime (MAXIPIME) IV Stopped (03/20/20 1418)   sodium chloride 0.45 % with kcl 75 mL/hr at 03/20/20 1348    Scheduled Meds:  chlorhexidine  15 mL Mouth Rinse BID   Chlorhexidine Gluconate Cloth  6 each Topical Daily   cholestyramine light  4 g Oral TID   enoxaparin (LOVENOX) injection  40 mg Subcutaneous Q24H   Gerhardt's butt cream   Topical BID   insulin aspart  0-5 Units Subcutaneous QHS   insulin aspart  0-9 Units Subcutaneous TID WC   insulin aspart  2 Units Subcutaneous TID WC   [START ON 03/21/2020] insulin detemir  15 Units Subcutaneous Q24H   linezolid  600 mg Oral Q12H   metroNIDAZOLE  500 mg Oral Q8H   mupirocin ointment  1 application Nasal BID   sodium chloride flush  10-40 mL Intracatheter Q12H   thiamine  100 mg Oral Daily    Antimicrobials: Anti-infectives (From admission, onward)   Start     Dose/Rate Route Frequency Ordered Stop   03/17/20 1415  ceFEPIme (MAXIPIME) 2 g in sodium chloride 0.9 % 100 mL IVPB        2 g 200 mL/hr over 30 Minutes Intravenous Every 8 hours 03/17/20 1410     03/17/20 1415  linezolid (ZYVOX) tablet 600 mg        600 mg Oral Every 12 hours 03/17/20 1410     03/17/20 1415  metroNIDAZOLE (FLAGYL) tablet 500 mg        500 mg Oral Every 8 hours 03/17/20 1410     03/16/20 1830  azithromycin (ZITHROMAX) 500 mg in sodium chloride 0.9 % 250 mL IVPB  Status:  Discontinued  500 mg 250 mL/hr over 60 Minutes Intravenous Every 24 hours 03/16/20 1804 03/17/20 1322   03/16/20 1830  cefTRIAXone (ROCEPHIN) 1 g in sodium chloride 0.9 % 100 mL IVPB  Status:  Discontinued        1 g 200 mL/hr over 30 Minutes Intravenous Every 24 hours 03/16/20 1804 03/17/20 1322   02/28/2020 2245  vancomycin (VANCOREADY) IVPB 2000 mg/400 mL        2,000 mg 200 mL/hr over 120 Minutes Intravenous  Once 02/29/2020 2242 03/16/20 0116   02/18/2020 2215  ceFEPIme (MAXIPIME) 2 g in sodium chloride 0.9 % 100  mL IVPB        2 g 200 mL/hr over 30 Minutes Intravenous  Once 02/17/2020 2202 03/04/2020 2302      PRN meds: Place/Maintain arterial line **AND** sodium chloride, dextrose, sodium chloride flush   Objective: Vitals:   03/20/20 1200 03/20/20 1305  BP: 107/68 99/72  Pulse: 73 83  Resp: 18 (!) 26  Temp: (!) 96 F (35.6 C)   SpO2: 99% 100%    Intake/Output Summary (Last 24 hours) at 03/20/2020 1426 Last data filed at 03/20/2020 1300 Gross per 24 hour  Intake 3268.14 ml  Output 1850 ml  Net 1418.14 ml   Filed Weights   03/16/20 1157  Weight: 72.6 kg   Weight change:  Body mass index is 22.96 kg/m.   Physical Exam: General exam: Appears calm and comfortable.  Looks lethargic Skin: No rashes, lesions or ulcers. HEENT: Atraumatic, normocephalic, supple neck, no obvious bleeding Lungs: Gurgly throat sound but clear to auscultation bilaterally CVS: Regular rate and rhythm, no murmur GI/Abd soft, distended, mild tenderness mostly in the lower abdomen, bowel sound present CNS: Opens eyes on verbal command, knows he is in the hospital.  Slow to respond Psychiatry: Depressed look Extremities: Trace pedal edema bilaterally  Data Review: I have personally reviewed the laboratory data and studies available.  Recent Labs  Lab 03/14/2020 2000 03/04/2020 2000 03/16/20 0049 03/17/20 1343 03/18/20 0529 03/19/20 0500 03/20/20 0604  WBC 16.6*   < > 14.3* 16.1* 12.5* 11.5* 12.8*  NEUTROABS 12.1*  --   --   --   --   --   --   HGB 7.0*   < > 8.7* 9.2* 8.5* 8.9* 9.3*  HCT 21.6*   < > 26.0* 27.1* 25.0* 26.3* 27.6*  MCV 105.9*   < > 101.2* 98.2 99.2 99.6 100.4*  PLT 171   < > 120* 120* 90* 89* 93*   < > = values in this interval not displayed.   Recent Labs  Lab 03/16/20 1928 03/17/20 0605 03/18/20 0529 03/19/20 0500 03/20/20 0604  NA 147* 151* 144 142 137  K 4.0 2.6* 3.6 3.8 4.5  CL 112* 116* 115* 116* 114*  CO2 23 22 20* 18* 17*  GLUCOSE 131* 132* 295* 116* 63*  BUN 16 11 9 9  7   CREATININE 1.11 0.87 0.82 0.82 0.67  CALCIUM 7.9* 7.7* 7.2* 7.3* 7.6*    F/u labs ordered  Signed, , MD [facility] 03/20/2020

## 2020-03-20 NOTE — Consult Note (Signed)
Subjective:   HPI  The patient is a 59 year old male with a reported history of polysubstance abuse in the past who also has a history of hypertension and diabetes mellitus who is noncompliant with medications.  He was admitted with septic shock secondary to DKA.  His source of infection was felt to be multilobar pneumonia.  We are asked to see him in regards to diarrhea and an abnormal CT scan of the abdomen noting progressive colitis.  He tells me that he started having diarrhea a few days before coming to the hospital.  His diarrhea has progressed.  He has been on broad-spectrum antibiotics while in the hospital and continues to have diarrhea.  A C. difficile was negative.  A GI pathogens panel was just sent today from what I can tell.  He has never had a problem with colitis in the past.    Past Medical History:  Diagnosis Date  . Diabetes mellitus without complication [facility])    Social History   Socioeconomic History  . Marital status: Single    Spouse name: Not on file  . Number of children: Not on file  . Years of education: Not on file  . Highest education level: Not on file  Occupational History  . Not on file  Tobacco Use  . Smoking status: Never Smoker  . Smokeless tobacco: Never Used  Substance and Sexual Activity  . Alcohol use: Never  . Drug use: Never  . Sexual activity: Not on file  Other Topics Concern  . Not on file  Social History Narrative  . Not on file   Social Determinants of Health   Financial Resource Strain:   . Difficulty of Paying Living Expenses: Not on file  Food Insecurity:   . Worried About Programme researcher, broadcasting/film/video in the Last Year: Not on file  . Ran Out of Food in the Last Year: Not on file  Transportation Needs:   . Lack of Transportation (Medical): Not on file  . Lack of Transportation (Non-Medical): Not on file  Physical Activity:   . Days of Exercise per Week: Not on file  . Minutes of Exercise per Session: Not on file  Stress:   . Feeling  of Stress : Not on file  Social Connections:   . Frequency of Communication with Friends and Family: Not on file  . Frequency of Social Gatherings with Friends and Family: Not on file  . Attends Religious Services: Not on file  . Active Member of Clubs or Organizations: Not on file  . Attends Banker Meetings: Not on file  . Marital Status: Not on file  Intimate Partner Violence:   . Fear of Current or Ex-Partner: Not on file  . Emotionally Abused: Not on file  . Physically Abused: Not on file  . Sexually Abused: Not on file   family history is not on file.  Current Facility-Administered Medications:  .  Place/Maintain arterial line, , , Until Discontinued **AND** 0.9 %  sodium chloride infusion, , Intra-arterial, PRN, Hollice Espy, MD .  cefTRIAXone (ROCEPHIN) 2 g in sodium chloride 0.9 % 100 mL IVPB, 2 g, Intravenous, Q24H, Dahal, Binaya, MD .  chlorhexidine (PERIDEX) 0.12 % solution 15 mL, 15 mL, Mouth Rinse, BID, Hollice Espy, MD, 15 mL at 03/20/20 0938 .  Chlorhexidine Gluconate Cloth 2 % PADS 6 each, 6 each, Topical, Daily, Dahal, Melina Schools, MD, 6 each at 03/20/20 0938 .  cholestyramine light (PREVALITE) packet 4 g, 4 g,  Oral, TID, Dahal, Binaya, MD .  dextrose 50 % solution 0-50 mL, 0-50 mL, Intravenous, PRN, Kirtland Bouchard, MD, 50 mL at 03/19/20 2237 .  enoxaparin (LOVENOX) injection 40 mg, 40 mg, Subcutaneous, Q24H, Kirtland Bouchard, MD, 40 mg at 03/20/20 6503 .  Gerhardt's butt cream, , Topical, BID, Lorin Glass, MD, Given at 03/20/20 1436 .  insulin aspart (novoLOG) injection 0-5 Units, 0-5 Units, Subcutaneous, QHS, Olalere, Adewale A, MD .  insulin aspart (novoLOG) injection 0-9 Units, 0-9 Units, Subcutaneous, TID WC, Olalere, Adewale A, MD, 2 Units at 03/19/20 1730 .  insulin aspart (novoLOG) injection 2 Units, 2 Units, Subcutaneous, TID WC, Dahal, Binaya, MD .  Melene Muller ON 03/21/2020] insulin detemir (LEVEMIR) injection 15 Units, 15 Units,  Subcutaneous, Q24H, Dahal, Binaya, MD .  magnesium sulfate IVPB 2 g 50 mL, 2 g, Intravenous, Once, Dahal, Binaya, MD .  metroNIDAZOLE (FLAGYL) tablet 500 mg, 500 mg, Oral, Q8H, Hollice Espy, MD, 500 mg at 03/20/20 1346 .  mupirocin ointment (BACTROBAN) 2 % 1 application, 1 application, Nasal, BID, Olalere, Adewale A, MD, 1 application at 03/20/20 0938 .  phosphorus (K PHOS NEUTRAL) tablet 500 mg, 500 mg, Oral, BID, Dahal, Binaya, MD .  sodium chloride 0.45 % 1,000 mL with potassium chloride 40 mEq infusion, , Intravenous, Continuous, Dahal, Binaya, MD, Last Rate: 75 mL/hr at 03/20/20 1348, Rate Change at 03/20/20 1348 .  sodium chloride flush (NS) 0.9 % injection 10-40 mL, 10-40 mL, Intracatheter, Q12H, Hollice Espy, MD, 20 mL at 03/20/20 0938 .  sodium chloride flush (NS) 0.9 % injection 10-40 mL, 10-40 mL, Intracatheter, PRN, Hollice Espy, MD .  sodium phosphate 30 mmol in dextrose 5 % 250 mL infusion, 30 mmol, Intravenous, Once, Dahal, Binaya, MD .  thiamine tablet 100 mg, 100 mg, Oral, Daily, Herby Abraham, RPH, 100 mg at 03/20/20 5465 Allergies  Allergen Reactions  . Penicillin G Shortness Of Breath  . Penicillins Anaphylaxis     Objective:     BP (!) 152/134 (BP Location: Left Arm)   Pulse 76   Temp (!) 96 F (35.6 C) (Axillary) Comment: reported to rn   Resp (!) 28   Ht 5\' 10"  (1.778 m)   Wt 72.6 kg   SpO2 99%   BMI 22.96 kg/m   No acute distress  Heart regular rhythm  Lungs with some rales  Abdomen bowel sounds present, diffuse mild tenderness but no rebound or guarding    Laboratory No components found for: D1    Assessment:     Septic shock  Multilobar pneumonia  Colitis presumably infectious but no obvious organism has been identified but also GI pathogens panel has just been sent.  This could be negative because he has been on antibiotics already.      Plan:     Continue supportive care.  Continue antibiotics.  Follow  clinically.

## 2020-03-20 NOTE — TOC Initial Note (Signed)
Transition of Care [facility]) - Initial/Assessment Note    Patient Details  Name: Joban Colledge MRN: [id number] Date of Birth: 02/10/1961  [facility] CM/SW Contact:    Golda Acre, RN Phone Number: 03/20/2020, 10:03 AM  Clinical Narrative:                 59 year old male with reported history of polysubstance abuse in the past as well as hypertension and diabetes mellitus, noncompliant with medications who was admitted on 9/29 and septic shock and secondary DKA.  Patient was found to have a lactic acid level of 6.4.  Source of infection felt to be multi lobar pneumonia.  He was also noted to have some diarrhea and a CT scan of the abdomen noted sigmoid colitis.  Despite aggressive fluid resuscitation, blood pressure still remained low and he was placed on pressor support.  Patient started on IV fluids and antibiotics.  Patient's DKA has since resolved and CBGs have stabilized and insulin drip able to be weaned off.  However, he remains on pressor support as he is still intravascularly volume depleted.  Continue to get aggressive IV fluids, broad-spectrum antibiotics.    Over the past few days, lactic acid level has trended downward, but seems to have leveled off around 2.8.  Procalcitonin also trending downward, but has stayed about the same. . At 0.74.  Patient still requiring pressor support.  No further fevers although he does complain of continued mild abdominal pain.  This morning, his abdomen is more distended.  Nursing unable to get stool sample which has been sent for C. difficile.  He remains on pressor support.  A stat CT scan of the abdomen pelvis has been ordered. Following for progression and toc needs=drug and alcohol resources. Expected Discharge Plan: Home/Self Care Barriers to Discharge: Continued Medical Work up   Patient Goals and CMS Choice Patient states their goals for this hospitalization and ongoing recovery are:: to go home CMS Medicare.gov Compare Post  Acute Care list provided to:: Patient    Expected Discharge Plan and Services Expected Discharge Plan: Home/Self Care   Discharge Planning Services: CM Consult   Living arrangements for the past 2 months: Single Family Home                                      Prior Living Arrangements/Services Living arrangements for the past 2 months: Single Family Home Lives with:: Self Patient language and need for interpreter reviewed:: Yes Do you feel safe going back to the place where you live?: Yes      Need for Family Participation in Patient Care: Yes (Comment) Care giver support system in place?: Yes (comment)   Criminal Activity/Legal Involvement Pertinent to Current Situation/Hospitalization: No - Comment as needed  Activities of Daily Living Home Assistive Devices/Equipment: CBG Meter ADL Screening (condition at time of admission) Patient's cognitive ability adequate to safely complete daily activities?: Yes Is the patient deaf or have difficulty hearing?: No Does the patient have difficulty seeing, even when wearing glasses/contacts?: No Does the patient have difficulty concentrating, remembering, or making decisions?: No Patient able to express need for assistance with ADLs?: Yes Does the patient have difficulty dressing or bathing?: No Independently performs ADLs?: Yes (appropriate for developmental age) Does the patient have difficulty walking or climbing stairs?: No Weakness of Legs: None Weakness of Arms/Hands: None  Permission Sought/Granted  Emotional Assessment Appearance:: Appears stated age Attitude/Demeanor/Rapport: Engaged Affect (typically observed): Calm Orientation: : Oriented to Self, Oriented to Place, Oriented to  Time, Oriented to Situation Alcohol / Substance Use: Illicit Drugs, Alcohol Use, Tobacco Use Psych Involvement: No (comment)  Admission diagnosis:  Dehydration [E86.0] Lactic acidosis [E87.2] DKA (diabetic  ketoacidoses) [E11.10] Hypernatremia [E87.0] SOB (shortness of breath) [R06.02] Healthcare-associated pneumonia [J18.9] Diabetic ketoacidosis without coma associated with type 2 diabetes mellitus (HCC) [E11.10] Anemia, unspecified type [D64.9] Patient Active Problem List   Diagnosis Date Noted  . Septic shock (HCC) 03/17/2020  . Acute hypernatremia 03/17/2020  . Multifocal pneumonia 03/17/2020  . Colitis 03/17/2020  . Polysubstance abuse (HCC) 03/17/2020  . Uncontrolled diabetes mellitus (HCC) 03/17/2020  . AKI (acute kidney injury) (HCC) 03/17/2020  . DKA (diabetic ketoacidoses) 03/16/2020   PCP:  Pcp, No Pharmacy:   [facility] [phone] - [place], [place] - [address] AT [facility] [address] [place] [address] Phone: [phone] Fax: [phone]  [id number] [place], [place] - [address] AT [facility] OF [address] [place] [address] Phone: [phone] Fax: [phone]     Social Determinants of Health (SDOH) Interventions    Readmission Risk Interventions No flowsheet data found.

## 2020-03-20 NOTE — Progress Notes (Addendum)
Inpatient Diabetes Program Recommendations  AACE/ADA: New Consensus Statement on Inpatient Glycemic Control  Target Ranges:  Prepandial:   less than 140 mg/dL      Peak postprandial:   less than 180 mg/dL (1-2 hours)      Critically ill patients:  140 - 180 mg/dL    Results for Roberto Rhodes, Roberto Rhodes (MRN [id number]) as of 03/18/2020 07:59  Ref. Range 03/16/2020 00:51  Hemoglobin A1C Latest Ref Range: 4.8 - 5.6 % >15.5 (H)    Review of Glycemic Control Results for Roberto Rhodes, Roberto Rhodes (MRN [id number]) as of 03/20/2020 09:20  Ref. Range 03/19/2020 07:25 03/19/2020 11:42 03/19/2020 15:45 03/19/2020 22:30 03/19/2020 22:33 03/19/2020 23:04 03/20/2020 07:49 03/20/2020 08:25  Glucose-Capillary Latest Ref Range: 70 - 99 mg/dL 833 (H) 825 (H) 053 (H) 45 (L) 49 (L) 127 (H) 54 (L) 60 (L)   Diabetes history: DM2 Outpatient Diabetes medications: Lantus 35 units QHS, Humalog 4-12 units TID with meals, Glipizide 5 mg QAM, Metformin 750 mg daily Current orders for Inpatient glycemic control: Levemir 22 units Q24H, Novolog 0-9 units TID with meals, Novolog 0-5 units QHS, Novolog 4 units TID with meals  Inpatient Diabetes Program Recommendations:     -  Consider reducing Novolog meal coverage to 2 units tid.   -  Consider reducing Levemir to 15 units.  Will see pt today.  Addendum 1151 am:  Spoke with pt at bedside regarding A1c level. Pt reports going to the transitional care clinic every 3 months and is able to get all of his needed insulins and medication there through med assist program till January 2022. Pt reports having a glucometer and checking his glucose frequently but not every day. Pt even endorses forgetting to take his medications. Pt lives with brother. Discussed different ways to remember to take his medications. Discussed importance of glucose control to reduce complications seen with DM. No needs identified at time of d/c.  Thanks,  Christena Deem RN, MSN, BC-ADM Inpatient Diabetes Coordinator Team Pager  [phone] (8a-5p)

## 2020-03-21 LAB — CBC WITH DIFFERENTIAL/PLATELET
Abs Immature Granulocytes: 0.1 10*3/uL — ABNORMAL HIGH (ref 0.00–0.07)
Basophils Absolute: 0.1 10*3/uL (ref 0.0–0.1)
Basophils Relative: 0 %
Eosinophils Absolute: 0.1 10*3/uL (ref 0.0–0.5)
Eosinophils Relative: 1 %
HCT: 28.5 % — ABNORMAL LOW (ref 39.0–52.0)
Hemoglobin: 9.6 g/dL — ABNORMAL LOW (ref 13.0–17.0)
Immature Granulocytes: 1 %
Lymphocytes Relative: 10 %
Lymphs Abs: 1.6 10*3/uL (ref 0.7–4.0)
MCH: 34.9 pg — ABNORMAL HIGH (ref 26.0–34.0)
MCHC: 33.7 g/dL (ref 30.0–36.0)
MCV: 103.6 fL — ABNORMAL HIGH (ref 80.0–100.0)
Monocytes Absolute: 0.8 10*3/uL (ref 0.1–1.0)
Monocytes Relative: 5 %
Neutro Abs: 13.5 10*3/uL — ABNORMAL HIGH (ref 1.7–7.7)
Neutrophils Relative %: 83 %
Platelets: 123 10*3/uL — ABNORMAL LOW (ref 150–400)
RBC: 2.75 MIL/uL — ABNORMAL LOW (ref 4.22–5.81)
RDW: 18.9 % — ABNORMAL HIGH (ref 11.5–15.5)
WBC: 16.2 10*3/uL — ABNORMAL HIGH (ref 4.0–10.5)
nRBC: 0.5 % — ABNORMAL HIGH (ref 0.0–0.2)

## 2020-03-21 LAB — CBC
HCT: 27.7 % — ABNORMAL LOW (ref 39.0–52.0)
Hemoglobin: 9.3 g/dL — ABNORMAL LOW (ref 13.0–17.0)
MCH: 34.4 pg — ABNORMAL HIGH (ref 26.0–34.0)
MCHC: 33.6 g/dL (ref 30.0–36.0)
MCV: 102.6 fL — ABNORMAL HIGH (ref 80.0–100.0)
Platelets: 109 10*3/uL — ABNORMAL LOW (ref 150–400)
RBC: 2.7 MIL/uL — ABNORMAL LOW (ref 4.22–5.81)
RDW: 18.1 % — ABNORMAL HIGH (ref 11.5–15.5)
WBC: 13.9 10*3/uL — ABNORMAL HIGH (ref 4.0–10.5)
nRBC: 0.7 % — ABNORMAL HIGH (ref 0.0–0.2)

## 2020-03-21 LAB — HEPATIC FUNCTION PANEL
ALT: 52 U/L — ABNORMAL HIGH (ref 0–44)
AST: 121 U/L — ABNORMAL HIGH (ref 15–41)
Albumin: 1.8 g/dL — ABNORMAL LOW (ref 3.5–5.0)
Alkaline Phosphatase: 156 U/L — ABNORMAL HIGH (ref 38–126)
Bilirubin, Direct: 1.3 mg/dL — ABNORMAL HIGH (ref 0.0–0.2)
Indirect Bilirubin: 1.3 mg/dL — ABNORMAL HIGH (ref 0.3–0.9)
Total Bilirubin: 2.6 mg/dL — ABNORMAL HIGH (ref 0.3–1.2)
Total Protein: 6.1 g/dL — ABNORMAL LOW (ref 6.5–8.1)

## 2020-03-21 LAB — BASIC METABOLIC PANEL
Anion gap: 9 (ref 5–15)
BUN: 9 mg/dL (ref 6–20)
CO2: 13 mmol/L — ABNORMAL LOW (ref 22–32)
Calcium: 7.1 mg/dL — ABNORMAL LOW (ref 8.9–10.3)
Chloride: 109 mmol/L (ref 98–111)
Creatinine, Ser: 0.79 mg/dL (ref 0.61–1.24)
GFR calc Af Amer: >60 mL/min (ref >60)
GFR calc non Af Amer: >60 mL/min (ref >60)
Glucose, Bld: 251 mg/dL — ABNORMAL HIGH (ref 70–99)
Potassium: 4.5 mmol/L (ref 3.5–5.1)
Sodium: 131 mmol/L — ABNORMAL LOW (ref 135–145)

## 2020-03-21 LAB — GLUCOSE, CAPILLARY
Glucose-Capillary: 230 mg/dL — ABNORMAL HIGH (ref 70–99)
Glucose-Capillary: 202 mg/dL — ABNORMAL HIGH (ref 70–99)
Glucose-Capillary: 120 mg/dL — ABNORMAL HIGH (ref 70–99)
Glucose-Capillary: 83 mg/dL (ref 70–99)

## 2020-03-21 LAB — MAGNESIUM: Magnesium: 1.9 mg/dL (ref 1.7–2.4)

## 2020-03-21 LAB — PHOSPHORUS: Phosphorus: 1.8 mg/dL — ABNORMAL LOW (ref 2.5–4.6)

## 2020-03-21 LAB — LACTIC ACID, PLASMA: Lactic Acid, Venous: 2.1 mmol/L (ref 0.5–1.9)

## 2020-03-21 MED ORDER — ADULT MULTIVITAMIN W/MINERALS CH
1.0000 | ORAL_TABLET | Freq: Every day | ORAL | Status: DC
  Administered 2020-03-21 – 2020-03-23 (×3): 1 via ORAL
  Filled 2020-03-21 (×3): qty 1

## 2020-03-21 MED ORDER — SODIUM PHOSPHATES 45 MMOLE/15ML IV SOLN
30.0000 mmol | Freq: Once | INTRAVENOUS | Status: AC
  Administered 2020-03-21: 30 mmol via INTRAVENOUS
  Filled 2020-03-21: qty 10

## 2020-03-21 MED ORDER — PROSOURCE PLUS PO LIQD
30.0000 mL | Freq: Three times a day (TID) | ORAL | Status: DC
  Administered 2020-03-21 – 2020-03-22 (×5): 30 mL via ORAL
  Filled 2020-03-21 (×7): qty 30

## 2020-03-21 MED ORDER — MAGNESIUM SULFATE 2 GM/50ML IV SOLN
2.0000 g | Freq: Once | INTRAVENOUS | Status: AC
  Administered 2020-03-21: 2 g via INTRAVENOUS
  Filled 2020-03-21: qty 50

## 2020-03-21 NOTE — Progress Notes (Addendum)
Inpatient Diabetes Program Recommendations  AACE/ADA: New Consensus Statement on Inpatient Glycemic Control  Target Ranges:  Prepandial:   less than 140 mg/dL      Peak postprandial:   less than 180 mg/dL (1-2 hours)      Critically ill patients:  140 - 180 mg/dL    Review of Glycemic Control Results for Roberto Rhodes, Roberto Rhodes (MRN [id number]) as of 03/21/2020 09:42  Ref. Range 03/20/2020 07:49 03/20/2020 08:25 03/20/2020 09:26 03/20/2020 12:45 03/20/2020 16:34 03/20/2020 20:37 03/21/2020 08:26  Glucose-Capillary Latest Ref Range: 70 - 99 mg/dL 54 (L) 60 (L) 77 91 262 (H) 143 (H) 230 (H)   Diabetes history: DM2 Outpatient Diabetes medications: Lantus 35 units QHS, Humalog 4-12 units TID with meals, Glipizide 5 mg QAM, Metformin 750 mg daily Current orders for Inpatient glycemic control: Levemir 15 units Q24H, Novolog 0-9 units TID with meals, Novolog 0-5 units QHS, Novolog 2 units TID with meals  Inpatient Diabetes Program Recommendations:     - Basal insulin dose adjusted yesterday, lantus was not given.  - Pt scheduled for Lantus 15 units this am. Watch trends for today.   Thanks,  Christena Deem RN, MSN, BC-ADM Inpatient Diabetes Coordinator Team Pager [phone] (8a-5p)

## 2020-03-21 NOTE — Progress Notes (Signed)
PT Cancellation Note  Patient Details Name: Roberto Rhodes MRN: [id number] DOB: 1960-08-17   Cancelled Treatment:    Reason Eval/Treat Not Completed: Attempted PT eval-pt declined participation on today. He requested PT check back another day.    Faye Ramsay, PT Acute Rehabilitation  Office: [phone] Pager: [phone]

## 2020-03-21 NOTE — Progress Notes (Signed)
Roberto Rhodes 59 y.o. 1961/01/15  CC:  Colitis  Subjective: Patient reports feeling better today, though continues to have lower abdominal pain.  Also reports heartburn, stating he takes medication at home for heartburn, though does not recall which medication.  Denies any nausea or vomiting.  ROS : Review of Systems  Respiratory: Positive for cough. Negative for shortness of breath.   Gastrointestinal: Positive for abdominal pain, diarrhea and heartburn. Negative for blood in stool, constipation, melena, nausea and vomiting.    Objective: Vital signs in last 24 hours: Vitals:   03/21/20 0900 03/21/20 1000  BP: 91/77 116/81  Pulse: 81 86  Resp: (!) 21 (!) 24  Temp:    SpO2: 97% 93%    Physical Exam:  General:  Alert, cooperative, no distress, appears stated age  Head:  Normocephalic, without obvious abnormality, atraumatic  Eyes:  Anicteric sclera, EOMs intact   Lungs:   +Coarse crackles bilaterally, tachypnea  Heart:  Regular rate and rhythm, S1, S2 normal  Abdomen:   Soft and nondistended with mild bilateral lower quadrant tenderness, hyperactive bowel sounds, no guarding or peritoneal signs, flexi seal with brown liquid stool  Extremities: +Bilateral pedal edema  Pulses: 2+ and symmetric    Lab Results: Recent Labs    03/20/20 0604 03/21/20 0500  NA 137 131*  K 4.5 4.5  CL 114* 109  CO2 17* 13*  GLUCOSE 63* 251*  BUN 7 9  CREATININE 0.67 0.79  CALCIUM 7.6* 7.1*  MG 1.4* 1.9  PHOS <1.0* 1.8*   Recent Labs    03/20/20 0604 03/21/20 0500  AST 158* 121*  ALT 60* 52*  ALKPHOS 150* 156*  BILITOT 2.2* 2.6*  PROT 5.7* 6.1*  ALBUMIN 1.6* 1.8*   Recent Labs    03/20/20 1920 03/21/20 0500  WBC 13.9* 16.2*  NEUTROABS  --  13.5*  HGB 9.3* 9.6*  HCT 27.7* 28.5*  MCV 102.6* 103.6*  PLT 109* 123*   No results for input(s): LABPROT, INR in the last 72 hours.    Assessment: Colitis, presumably infectious -GI pathogen  panel pending, C diff negative  Pneumonia, Septic shock  Transaminitis, most likely from septic shock  Macrocytic anemia, stable  DM type 2  Plan: Continue empiric antibiotics and supportive care.  Await GI pathogen panel, though may be negative due to antibiotic therapy.  [facility] will follow.  Edrick Kins PA-C 03/21/2020, 10:55 AM  Contact #  [phone]

## 2020-03-21 NOTE — Progress Notes (Signed)
CRITICAL VALUE ALERT  Critical Value: lactic acid 2.1  Date & Time Notied: 10/5 0943  Provider Notified: Yes  Orders Received/Actions taken: Awaiting orders

## 2020-03-21 NOTE — Progress Notes (Signed)
PROGRESS NOTE  Roberto Rhodes  DOB: 1961/01/24  PCP: Aviva Kluver TXM:[id number]  DOA: March 31, 2020  LOS: 5 days   Chief Complaint  Patient presents with  . Weakness  . hyperglycemic   Brief narrative: Patient is 59 year old male with PMH significant for diabetes mellitus type 2,, HTN polysubstance abuse, noncompliance with medications.  9/30, patient was brought to the ED from home with extreme weakness. He had generalized weakness for 3 to 4 days and intermittent diarrhea for 3 weeks.  In the ED, his blood pressure dropped less than 90s. Labs showed significant hyperglycemia to more than 1000, mildly elevated b-hydroxybutyrate, elevated anion gap but normal bicarbonate and normal pH.  Calculated serum osmolality was high at 350. Corrected sodium level was 153, creatinine 1.75.  Lactic acid level was elevated to 6.4 Chest x-ray showed mild right infrahilar atelectasis and/or infiltrate.   CT chest showed bibasilar consolidation consistent with multifocal pneumonia. C abdomen pelvis showed wall thickening in the rectosigmoid in a circumferential fashion with surrounding inflammatory change consistent with focal colitis. He was admitted to ICU in a state of septic shock.  He was started on broad-spectrum antibiotics, aggressive IV hydration, IV insulin drip and electrolyte replacements.  Septic shock parameters as well as lactic acid level gradually improved.  Patient is off pressors since 10/4. Glucose level have stabilized on insulin drip and is currently on subcutaneous insulin.  However, patient continues to have persistent liquid bowel movement. 10/3, repeat CT scan of abdomen and pelvis showed progressive infectious/inflammatory colitis, now particularly involving the hepatic flexure.   Subjective: Patient was seen and examined this morning. Still seems lethargic.  Gurgling sound.  Chest auscultation is clear however Continues to have liquid stool output, measured more than 3 L in last  24 hours.  Assessment/Plan: Septic shock - POA -secondary to multifocal pneumonia and colitis.  Management of individual issues as below.  -Currently on IV Rocephin and IV Flagyl. -WBC, lactic acid and procalcitonin level were improving but seem to be trending up today. -Continue to monitor. Recent Labs  Lab 03/17/20 0948 03/17/20 1343 03/18/20 0528 03/18/20 0529 03/18/20 0741 03/18/20 1310 03/19/20 0500 03/20/20 0604 03/20/20 1920 03/21/20 0500 03/21/20 0915  WBC  --    < >  --  12.5*  --   --  11.5* 12.8* 13.9* 16.2*  --   LATICACIDVEN 4.0*   < > 3.1*  --   --  2.7* 2.8* 1.9  --   --  2.1*  PROCALCITON 0.94  --   --  0.81 0.79  --  0.74 1.07  --   --   --    < > = values in this interval not displayed.   Multifocal pneumonia -CT chest on admission showed bibasilar consolidation consistent with multifocal pneumonia. -Antibiotics as stated above. -Not on supplemental oxygen.  Acute colitis -Per history, patient had intermittent diarrhea for 3 weeks. -CT abdomen on admission showed colitis of the rectosigmoid area. -C. difficile negative.  Pending GI pathogen panel. -Despite IV antibiotics, patient continues to have persistent liquid bowel movement.  -10/3, repeat CT scan of abdomen and pelvis showed progressive infectious/inflammatory colitis, now particularly involving the hepatic flexure.  -Currently patient has Flexi-Seal with more than 3 L of liquid output last 24 hours. -10/4, I started the patient on Questran TID.  GI following as well.  Hyperosmolar hyperglycemic state Mild ketosis DKA - ruled out -Labs on admission showed significant hyperglycemia to more than 1000, mildly elevated b-hydroxybutyrate, elevated anion gap but  normal bicarbonate and normal pH.  Calculated serum osmolality was high at 350. -Patient was admitted on insulin drip.  Adequately hydrated.  Eventually switched to subcutaneous insulin.  Type 2 diabetes mellitus -A1c more than 15.5 on  9/30 -Currently on subcutaneous insulin.  Diabetes care coordinator consult appreciated.   -Continue Levemir to 15 units daily and NovoLog 2 units 3 times daily Premeal.  Continue sliding scale insulin with checks.   Recent Labs  Lab 03/20/20 1245 03/20/20 1634 03/20/20 2037 03/21/20 0826 03/21/20 1148  GLUCAP 91 174* 143* 230* 202*   AKI -Improved with hydration. Recent Labs    03/16/20 0049 03/16/20 0530 03/16/20 1049 03/16/20 1315 03/16/20 1928 03/17/20 0605 03/18/20 0529 03/19/20 0500 03/20/20 0604 03/21/20 0500  BUN 25* 22* 21* 18 16 11 9 9 7 9   CREATININE 1.37* 1.20 1.06 0.97 1.11 0.87 0.82 0.82 0.67 0.79   Hypernatremia/hyponatremia -Corrected sodium level on admission was severely high at 153. -Adequately hydrated.  Sodium level is now heading to hyponatremia. Recent Labs  Lab 03/16/20 0049 03/16/20 0530 03/16/20 1049 03/16/20 1315 03/16/20 1928 03/17/20 0605 03/18/20 0529 03/19/20 0500 03/20/20 0604 03/21/20 0500  NA 149* 150* 146* 147* 147* 151* 144 142 137 131*   Hypokalemia//hypomagnesemia hypophosphatemia -Level trend as below -I have scheduled him on oral potassium for spit twice daily.  Added IV phosphate and IV magnesium replacement this morning.  Repeat labs tomorrow Recent Labs  Lab 03/17/20 0605 03/18/20 0529 03/19/20 0500 03/20/20 0604 03/21/20 0500  K 2.6* 3.6 3.8 4.5 4.5  MG  --   --   --  1.4* 1.9  PHOS  --   --   --  <1.0* 1.8*   Elevated liver enzymes -Likely related to chronic alcoholism and septic shock.  No evidence of liver cirrhosis in imaging. -Liver enzymes improving. Recent Labs  Lab 03/09/2020 2000 03/19/20 0500 03/20/20 0604 03/21/20 0500  AST 104* 157* 158* 121*  ALT 60* 62* 60* 52*  ALKPHOS 204* 145* 150* 156*  BILITOT 1.7* 2.0* 2.2* 2.6*  PROT 7.2 5.6* 5.7* 6.1*  ALBUMIN 2.3* 1.6* 1.6* 1.8*   Hypoalbuminemia Malnutrition -Albumin level low at 1.6.  Likely because of third spacing evidenced as pedal edema  as well as pleural effusion.  -Nutrition consulted.  Chronic macrocytic anemia -Macrocytosis likely due to alcoholism. -Hemoccult positive but hemoglobin remained stable. -Ferritin level elevated to almost 2000.  Vitamin B12 1 folate level elevated as well. Recent Labs    03/04/2020 2000 03/16/20 0000 03/16/20 0049 03/18/20 0529 03/18/20 0529 03/19/20 0500 03/19/20 0500 03/20/20 0604 03/20/20 0604 03/20/20 1920 03/21/20 0500  HGB 7.0*  --    < > 8.5*  --  8.9*  --  9.3*  --  9.3* 9.6*  MCV 105.9*  --    < > 99.2   < > 99.6   < > 100.4*   < > 102.6* 103.6*  VITAMINB12  --  1,040*  --   --   --   --   --   --   --   --   --   FOLATE  --  >100.0  --   --   --   --   --   --   --   --   --   FERRITIN  --  1,990*  --   --   --   --   --   --   --   --   --   TIBC  --  113*  --   --   --   --   --   --   --   --   --   IRON  --  117  --   --   --   --   --   --   --   --   --   RETICCTPCT 1.5  --   --   --   --   --   --   --   --   --   --    < > = values in this interval not displayed.   Generalized weakness/lethargy -Multifactorial: Septic shock, profuse diarrhea, low electrolytes, poor oral intake. -Somnolent but wakes up and answer questions.  Not delirious. -PT eval when able to participate  Polysubstance abuse Chronic alcoholism -Needs to quit.  Counseled  Mobility: Encourage ambulation.  PT evaluation when able to participate Code Status:   Code Status: Full Code  Nutritional status: Body mass index is 22.96 kg/m. Nutrition Problem: Moderate Malnutrition Etiology: acute illness, diarrhea, other (see comment) (persistent hyperglycemia) Signs/Symptoms: mild fat depletion, mild muscle depletion Diet Order            Diet Carb Modified Fluid consistency: Thin; Room service appropriate? Yes  Diet effective now                 DVT prophylaxis: enoxaparin (LOVENOX) injection 40 mg Start: 03/16/20 1000 SCDs Start: 03/16/20 0050   Antimicrobials:  IV Rocephin/IV  Flagyl Fluid: normal saline to 75 mill per hour Consultants: GI Family Communication:  None at bedside  Status is: Inpatient  Remains inpatient appropriate because has persistent diarrhea, lethargy.   Dispo: The patient is from: Home              Anticipated d/c is to: Home  Vs SNF              Anticipated d/c date is: > 3 days              Patient currently is not medically stable to d/c.    Infusions:  . sodium chloride    . cefTRIAXone (ROCEPHIN)  IV Stopped (03/20/20 2357)  . sodium chloride 0.45 % with kcl 75 mL/hr at 03/21/20 1001  . sodium phosphate  Dextrose 5% IVPB 30 mmol (03/21/20 1013)    Scheduled Meds: . chlorhexidine  15 mL Mouth Rinse BID  . Chlorhexidine Gluconate Cloth  6 each Topical Daily  . cholestyramine light  4 g Oral TID  . enoxaparin (LOVENOX) injection  40 mg Subcutaneous Q24H  . Gerhardt's butt cream   Topical BID  . insulin aspart  0-5 Units Subcutaneous QHS  . insulin aspart  0-9 Units Subcutaneous TID WC  . insulin aspart  2 Units Subcutaneous TID WC  . insulin detemir  15 Units Subcutaneous Q24H  . metroNIDAZOLE  500 mg Oral Q8H  . phosphorus  500 mg Oral BID  . sodium chloride flush  10-40 mL Intracatheter Q12H  . thiamine  100 mg Oral Daily    Antimicrobials: Anti-infectives (From admission, onward)   Start     Dose/Rate Route Frequency Ordered Stop   03/20/20 2200  cefTRIAXone (ROCEPHIN) 2 g in sodium chloride 0.9 % 100 mL IVPB        2 g 200 mL/hr over 30 Minutes Intravenous Every 24 hours 03/20/20 1440     03/17/20 1415  ceFEPIme (MAXIPIME) 2 g in sodium chloride 0.9 % 100 mL IVPB  Status:  Discontinued        2 g 200 mL/hr over 30 Minutes Intravenous Every 8 hours 03/17/20 1410 03/20/20 1437   03/17/20 1415  linezolid (ZYVOX) tablet 600 mg  Status:  Discontinued        600 mg Oral Every 12 hours 03/17/20 1410 03/20/20 1437   03/17/20 1415  metroNIDAZOLE (FLAGYL) tablet 500 mg        500 mg Oral Every 8 hours 03/17/20 1410      03/16/20 1830  azithromycin (ZITHROMAX) 500 mg in sodium chloride 0.9 % 250 mL IVPB  Status:  Discontinued        500 mg 250 mL/hr over 60 Minutes Intravenous Every 24 hours 03/16/20 1804 03/17/20 1322   03/16/20 1830  cefTRIAXone (ROCEPHIN) 1 g in sodium chloride 0.9 % 100 mL IVPB  Status:  Discontinued        1 g 200 mL/hr over 30 Minutes Intravenous Every 24 hours 03/16/20 1804 03/17/20 1322   02/29/2020 2245  vancomycin (VANCOREADY) IVPB 2000 mg/400 mL        2,000 mg 200 mL/hr over 120 Minutes Intravenous  Once 03/06/2020 2242 03/16/20 0116   03/03/2020 2215  ceFEPIme (MAXIPIME) 2 g in sodium chloride 0.9 % 100 mL IVPB        2 g 200 mL/hr over 30 Minutes Intravenous  Once 02/22/2020 2202 02/17/2020 2302      PRN meds: Place/Maintain arterial line **AND** sodium chloride, dextrose, morphine injection, sodium chloride flush   Objective: Vitals:   03/21/20 1000 03/21/20 1100  BP: 116/81 134/86  Pulse: 86 95  Resp: (!) 24 (!) 22  Temp:    SpO2: 93% 95%    Intake/Output Summary (Last 24 hours) at 03/21/2020 1221 Last data filed at 03/21/2020 1001 Gross per 24 hour  Intake 2312.61 ml  Output 3250 ml  Net -937.39 ml   Filed Weights   03/16/20 1157  Weight: 72.6 kg   Weight change:  Body mass index is 22.96 kg/m.   Physical Exam: General exam: Lethargic look Skin: No rashes, lesions or ulcers. HEENT: Atraumatic, normocephalic, supple neck, no obvious bleeding Lungs: Gurgly throat sound but clear to auscultation bilaterally CVS: Regular rate and rhythm, no murmur GI/Abd soft, distended, mild tenderness mostly in the lower abdomen, bowel sound present CNS: Opens eyes on verbal command, knows he is in the hospital.  Slow to respond Psychiatry: Depressed look Extremities: Trace pedal edema bilaterally  Data Review: I have personally reviewed the laboratory data and studies available.  Recent Labs  Lab 03/10/2020 2000 03/16/20 0049 03/18/20 0529 03/19/20 0500 03/20/20 0604  03/20/20 1920 03/21/20 0500  WBC 16.6*   < > 12.5* 11.5* 12.8* 13.9* 16.2*  NEUTROABS 12.1*  --   --   --   --   --  13.5*  HGB 7.0*   < > 8.5* 8.9* 9.3* 9.3* 9.6*  HCT 21.6*   < > 25.0* 26.3* 27.6* 27.7* 28.5*  MCV 105.9*   < > 99.2 99.6 100.4* 102.6* 103.6*  PLT 171   < > 90* 89* 93* 109* 123*   < > = values in this interval not displayed.   Recent Labs  Lab 03/17/20 0605 03/18/20 0529 03/19/20 0500 03/20/20 0604 03/21/20 0500  NA 151* 144 142 137 131*  K 2.6* 3.6 3.8 4.5 4.5  CL 116* 115* 116* 114* 109  CO2 22 20* 18* 17* 13*  GLUCOSE 132* 295* 116* 63* 251*  BUN 11 9  9 7 9   CREATININE 0.87 0.82 0.82 0.67 0.79  CALCIUM 7.7* 7.2* 7.3* 7.6* 7.1*  MG  --   --   --  1.4* 1.9  PHOS  --   --   --  <1.0* 1.8*    F/u labs ordered  Signed, Lorin GlassBinaya Aysha Livecchi, MD [facility] 03/21/2020

## 2020-03-21 NOTE — Progress Notes (Addendum)
Initial Nutrition Assessment  DOCUMENTATION CODES:   Non-severe (moderate) malnutrition in context of acute illness/injury  INTERVENTION:  - will order 30 ml Prosource Plus TID, each supplement provides 100 kcal and 15 grams protein.  - will order 1 tablet multivitamin with minerals/day.  NUTRITION DIAGNOSIS:   Moderate Malnutrition related to acute illness, diarrhea, other (see comment) (persistent hyperglycemia) as evidenced by mild fat depletion, mild muscle depletion.  GOAL:   Patient will meet greater than or equal to 90% of their needs  MONITOR:   PO intake, Supplement acceptance, Labs, Weight trends  REASON FOR ASSESSMENT:   Consult Assessment of nutrition requirement/status  ASSESSMENT:   59 year old male with medical history of type 2 diabetes mellitus, HTN, polysubstance abuse, and non-compliance with medications. He presented to the ED on 9/30 with extreme weakness x3-4 days with intermittent diarrhea for 3 weeks. CT chest at that time showed bibasilar consolidation consistent with multifocal PNA. CT abd/pelvis showed wall thickening to the rectosigmoid area with focal colitis. He was admitted with septic shock. Pressors were stopped on 10/4. Repeat CT abd/pelvis on 10/3 showed progressive infectious inflammatory colitis now involving the hepatic flexure.  The only intakes documented in the chart are 50% of lunch on 10/1 and 50% of lunch on 10/2. Patient reports eating nearly 100% of breakfast this AM which consisted of a ham, cheese, and tomato omelet. He reports some "gurgling" with the meal but reports he has had this experience for a long time when he consumes dairy products.  Patient was diagnosed with DM three years ago and sees endocrinologist every 3 months. He typically eats 2 meals/day: lunch around 11 AM and dinner around 6 PM. He follows a high protein, low carb diet, per his report. He states that a doctor talked with him about eating 3 meals/day and about the  need for carbohydrates in his diet. Further explained this and the rationale to patient. He is very open to making changes.   Overall, his stomach has been feeling better. Talked about the importance of adequate hydration and patient confirms that he has been focusing on this and consuming fluids throughout the day.   Weight on admission (9/30) was 160 lb. He has not been weighed since then. PTA, the most recently documented weight was on 07/15/19 at [facility] when he weighed 178 lb. This indicates 18 lb weight loss (10.1%) in the past 9 months; not significant for time frame but unsure if weight loss has occurred more acutely.   Per notes: - septic shock on admission - multifocal PNA - acute colitis - AKI - chronic macrocytic anemia - generalized weakness and lethargy - polysubstance abuse/alcohol abuse   Labs reviewed; HgbA1c on 9/30: 15.5%, CBG: 230 mg/dl, Na: 131 mmol/l, Ca: 7.1 mg/dl, Phos: 1.8 mg/dl, LFTs and Alk Phos elevated. Medications reviewed; sliding scale novolog, 2 units novolog TID, 15 units levemir/day, 2 g IV Mg sulfate x1 run 10/4 and x1 run 10/5, 30 mmol IV NaPhos x1 run 10/5, 100 mg oral thiamine/day.  IVF; 1/2 NS-40 mEq KCl @ 75 ml/hr.    NUTRITION - FOCUSED PHYSICAL EXAM:    Most Recent Value  Orbital Region No depletion  Upper Arm Region Mild depletion  Thoracic and Lumbar Region Unable to assess  Buccal Region No depletion  Temple Region No depletion  Clavicle Bone Region Mild depletion  Clavicle and Acromion Bone Region Mild depletion  Scapular Bone Region Unable to assess  Dorsal Hand No depletion  Patellar Region Unable to assess  Anterior Thigh Region Unable to assess  Posterior Calf Region Unable to assess  Edema (RD Assessment) Unable to assess  Hair Reviewed  Eyes Reviewed  Mouth Reviewed  Skin Reviewed  Nails Reviewed       Diet Order:   Diet Order            Diet Carb Modified Fluid consistency: Thin; Room service appropriate? Yes  Diet  effective now                 EDUCATION NEEDS:   No education needs have been identified at this time  Skin:  Skin Assessment: Skin Integrity Issues: Skin Integrity Issues:: Other (Comment) Other: MASD to scrotum and penis  Last BM:  10/5  Height:   Ht Readings from Last 1 Encounters:  03/16/20 5' 10"  (1.778 m)    Weight:   Wt Readings from Last 1 Encounters:  03/16/20 72.6 kg     Estimated Nutritional Needs:  Kcal:  2100-2300 kcal Protein:  105-120 grams Fluid:  >/= 2.7 L/day     Jarome Matin, MS, RD, LDN, CNSC Inpatient Clinical Dietitian RD pager # available in [facility]  After hours/weekend pager # available in [facility]

## 2020-03-22 DIAGNOSIS — E440 Moderate protein-calorie malnutrition: Secondary | ICD-10-CM | POA: Insufficient documentation

## 2020-03-22 LAB — GASTROINTESTINAL PANEL BY PCR, STOOL (REPLACES STOOL CULTURE)

## 2020-03-22 LAB — MAGNESIUM
Magnesium: 1.7 mg/dL (ref 1.7–2.4)
Magnesium: 2 mg/dL (ref 1.7–2.4)

## 2020-03-22 LAB — BASIC METABOLIC PANEL
Anion gap: 7 (ref 5–15)
BUN: 9 mg/dL (ref 6–20)
CO2: 15 mmol/L — ABNORMAL LOW (ref 22–32)
Calcium: 6.9 mg/dL — ABNORMAL LOW (ref 8.9–10.3)
Chloride: 111 mmol/L (ref 98–111)
Creatinine, Ser: 0.65 mg/dL (ref 0.61–1.24)
GFR calc non Af Amer: >60 mL/min (ref >60)
Glucose, Bld: 117 mg/dL — ABNORMAL HIGH (ref 70–99)
Potassium: 4.2 mmol/L (ref 3.5–5.1)
Sodium: 133 mmol/L — ABNORMAL LOW (ref 135–145)

## 2020-03-22 LAB — CBC WITH DIFFERENTIAL/PLATELET
Abs Immature Granulocytes: 0.11 10*3/uL — ABNORMAL HIGH (ref 0.00–0.07)
Basophils Absolute: 0.1 10*3/uL (ref 0.0–0.1)
Basophils Relative: 1 %
Eosinophils Absolute: 0.2 10*3/uL (ref 0.0–0.5)
Eosinophils Relative: 1 %
HCT: 23.1 % — ABNORMAL LOW (ref 39.0–52.0)
Hemoglobin: 7.6 g/dL — ABNORMAL LOW (ref 13.0–17.0)
Immature Granulocytes: 1 %
Lymphocytes Relative: 15 %
Lymphs Abs: 1.8 10*3/uL (ref 0.7–4.0)
MCH: 34.7 pg — ABNORMAL HIGH (ref 26.0–34.0)
MCHC: 32.9 g/dL (ref 30.0–36.0)
MCV: 105.5 fL — ABNORMAL HIGH (ref 80.0–100.0)
Monocytes Absolute: 0.7 10*3/uL (ref 0.1–1.0)
Monocytes Relative: 6 %
Neutro Abs: 9.3 10*3/uL — ABNORMAL HIGH (ref 1.7–7.7)
Neutrophils Relative %: 76 %
Platelets: 128 10*3/uL — ABNORMAL LOW (ref 150–400)
RBC: 2.19 MIL/uL — ABNORMAL LOW (ref 4.22–5.81)
RDW: 19.6 % — ABNORMAL HIGH (ref 11.5–15.5)
WBC: 12.1 10*3/uL — ABNORMAL HIGH (ref 4.0–10.5)
nRBC: 0.7 % — ABNORMAL HIGH (ref 0.0–0.2)

## 2020-03-22 LAB — GLUCOSE, CAPILLARY
Glucose-Capillary: 97 mg/dL (ref 70–99)
Glucose-Capillary: 119 mg/dL — ABNORMAL HIGH (ref 70–99)
Glucose-Capillary: 122 mg/dL — ABNORMAL HIGH (ref 70–99)
Glucose-Capillary: 134 mg/dL — ABNORMAL HIGH (ref 70–99)

## 2020-03-22 LAB — PHOSPHORUS
Phosphorus: 1.9 mg/dL — ABNORMAL LOW (ref 2.5–4.6)
Phosphorus: 2.1 mg/dL — ABNORMAL LOW (ref 2.5–4.6)

## 2020-03-22 LAB — LACTIC ACID, PLASMA: Lactic Acid, Venous: 1.3 mmol/L (ref 0.5–1.9)

## 2020-03-22 MED ORDER — SODIUM PHOSPHATES 45 MMOLE/15ML IV SOLN
30.0000 mmol | Freq: Once | INTRAVENOUS | Status: AC
  Administered 2020-03-22: 30 mmol via INTRAVENOUS
  Filled 2020-03-22: qty 10

## 2020-03-22 MED ORDER — SODIUM BICARBONATE 8.4 % IV SOLN
INTRAVENOUS | Status: DC
  Filled 2020-03-22: qty 850
  Filled 2020-03-22 (×2): qty 150

## 2020-03-22 MED ORDER — ALBUMIN HUMAN 25 % IV SOLN
12.5000 g | Freq: Once | INTRAVENOUS | Status: AC
  Administered 2020-03-22: 12.5 g via INTRAVENOUS
  Filled 2020-03-22: qty 50

## 2020-03-22 MED ORDER — MORPHINE SULFATE (PF) 2 MG/ML IV SOLN
1.0000 mg | Freq: Four times a day (QID) | INTRAVENOUS | Status: DC | PRN
  Administered 2020-03-22 – 2020-03-23 (×2): 1 mg via INTRAVENOUS
  Filled 2020-03-22 (×2): qty 1

## 2020-03-22 MED ORDER — PANTOPRAZOLE SODIUM 40 MG PO TBEC
40.0000 mg | DELAYED_RELEASE_TABLET | Freq: Every day | ORAL | Status: DC
  Administered 2020-03-22 – 2020-03-23 (×2): 40 mg via ORAL
  Filled 2020-03-22 (×2): qty 1

## 2020-03-22 MED ORDER — CHOLESTYRAMINE LIGHT 4 G PO PACK
4.0000 g | PACK | Freq: Four times a day (QID) | ORAL | Status: DC
  Administered 2020-03-22 – 2020-03-23 (×7): 4 g via ORAL
  Filled 2020-03-22 (×9): qty 1

## 2020-03-22 NOTE — Progress Notes (Signed)
[facility]ology Progress Note  Roberto Rhodes 59 y.o. [birth year]  CC:  Colitis  Subjective: Patient reports feeling better today, though continues to have some left lower quadrant abdominal pain.  Denies any nausea or vomiting, though does endorse heartburn.  Is tolerating a regular diet.  ROS : Review of Systems  Respiratory: Positive for cough. Negative for shortness of breath.   Gastrointestinal: Positive for abdominal pain, diarrhea and heartburn. Negative for blood in stool, constipation, melena, nausea and vomiting.    Objective: Vital signs in last 24 hours: Vitals:   03/22/20 0600 03/22/20 0800  BP: 119/72   Pulse: 88   Resp: 18   Temp:  (!) 97.5 F (36.4 C)  SpO2: 95%     Physical Exam:  General:  Alert, cooperative, no distress, appears stated age  Head:  Normocephalic, without obvious abnormality, atraumatic  Eyes:  Anicteric sclera, EOMs intact   Lungs:   No respiratory distress  Heart:  Regular rate and rhythm, S1, S2 normal  Abdomen:   Soft and nondistended with mild left lower quadrant tenderness, no guarding or peritoneal signs, flexi seal with small amount of clear-yellow liquid stool  Extremities: +Bilateral pedal edema  Pulses: 2+ and symmetric    Lab Results: Recent Labs    03/22/20 0546 03/22/20 0648  NA QUESTIONABLE RESULTS, RECOMMEND RECOLLECT TO VERIFY 133*  K QUESTIONABLE RESULTS, RECOMMEND RECOLLECT TO VERIFY 4.2  CL QUESTIONABLE RESULTS, RECOMMEND RECOLLECT TO VERIFY 111  CO2 QUESTIONABLE RESULTS, RECOMMEND RECOLLECT TO VERIFY 15*  GLUCOSE QUESTIONABLE RESULTS, RECOMMEND RECOLLECT TO VERIFY 117*  BUN QUESTIONABLE RESULTS, RECOMMEND RECOLLECT TO VERIFY 9  CREATININE QUESTIONABLE RESULTS, RECOMMEND RECOLLECT TO VERIFY 0.65  CALCIUM QUESTIONABLE RESULTS, RECOMMEND RECOLLECT TO VERIFY 6.9*  MG 1.7 2.0  PHOS 1.9* 2.1*   Recent Labs    03/20/20 0604 03/21/20 0500  AST 158* 121*  ALT 60* 52*  ALKPHOS 150* 156*  BILITOT 2.2* 2.6*   PROT 5.7* 6.1*  ALBUMIN 1.6* 1.8*   Recent Labs    03/21/20 0500 03/22/20 0546  WBC 16.2* 12.1*  NEUTROABS 13.5* 9.3*  HGB 9.6* 7.6*  HCT 28.5* 23.1*  MCV 103.6* 105.5*  PLT 123* 128*   No results for input(s): LABPROT, INR in the last 72 hours.  Assessment: Colitis, presumably infectious -C diff negative, GI pathogen panel negative, though may be false negative due to antibiotic therapy. -Stool output decreasing  GERD: on daily PPI at home  Pneumonia, Septic shock  Transaminitis, most likely from septic shock  Macrocytic anemia: Hgb decreased to 7.6 today as compared to 9.6 yesterday, though no signs of GI bleeding  DM type 2  Plan: Continue empiric antibiotics.  We will continue to follow clinically.  Protonix PO daily for GERD.  [facility] will follow.  Edrick Kins PA-C 03/22/2020, 10:33 AM  Contact #  [phone]

## 2020-03-22 NOTE — Progress Notes (Signed)
PROGRESS NOTE  Roberto Rhodes  DOB: 1961/05/20  PCP: Aviva Kluver ZOX:[id number]  DOA: 03/14/2020  LOS: 6 days   Chief Complaint  Patient presents with  . Weakness  . hyperglycemic   Brief narrative: Patient is 59 year old male with PMH significant for diabetes mellitus type 2,, HTN polysubstance abuse, noncompliance with medications.  9/30, patient was brought to the ED from home with extreme weakness. He had generalized weakness for 3 to 4 days and intermittent diarrhea for 3 weeks.  In the ED, his blood pressure dropped less than 90s. Labs showed significant hyperglycemia to more than 1000, mildly elevated b-hydroxybutyrate, elevated anion gap but normal bicarbonate and normal pH.  Calculated serum osmolality was high at 350. Corrected sodium level was 153, creatinine 1.75.  Lactic acid level was elevated to 6.4 Chest x-ray showed mild right infrahilar atelectasis and/or infiltrate.   CT chest showed bibasilar consolidation consistent with multifocal pneumonia. C abdomen pelvis showed wall thickening in the rectosigmoid in a circumferential fashion with surrounding inflammatory change consistent with focal colitis. He was admitted to ICU in a state of septic shock.  He was started on broad-spectrum antibiotics, aggressive IV hydration, IV insulin drip and electrolyte replacements.  Septic shock parameters as well as lactic acid level gradually improved.  Patient is off pressors since 10/4. Glucose level have stabilized on insulin drip and is currently on subcutaneous insulin.  However, patient continues to have persistent liquid bowel movement. 10/3, repeat CT scan of abdomen and pelvis showed progressive infectious/inflammatory colitis, now particularly involving the hepatic flexure.   Subjective: Patient was seen and examined this morning. Remains lethargic.  Stool volume seems to be slowing down.  Only 1 L of liquid bowel movement in last 24 hours compared to 3.3 L before  that.  Assessment/Plan: Septic shock - POA -secondary to multifocal pneumonia and colitis.  Management of individual issues as below.  -Currently on IV Rocephin and IV Flagyl. -WBC, lactic acid and procalcitonin level are improving.  Trend as below. -Continue to monitor. Recent Labs  Lab 03/17/20 0948 03/17/20 1343 03/18/20 0528 03/18/20 0529 03/18/20 0529 03/18/20 0741 03/18/20 1310 03/19/20 0500 03/20/20 0604 03/20/20 1920 03/21/20 0500 03/21/20 0915 03/22/20 0546  WBC  --    < >  --  12.5*   < >  --   --  11.5* 12.8* 13.9* 16.2*  --  12.1*  LATICACIDVEN 4.0*   < >   < >  --   --   --  2.7* 2.8* 1.9  --   --  2.1* 1.3  PROCALCITON 0.94  --   --  0.81  --  0.79  --  0.74 1.07  --   --   --   --    < > = values in this interval not displayed.   Acute colitis -Per history, patient had intermittent diarrhea for 3 weeks. -CT abdomen on admission showed colitis of the rectosigmoid area. -10/3, repeat CT scan of abdomen and pelvis showed progressive infectious/inflammatory colitis, now particularly involving the hepatic flexure.  -C. difficile negative.  GI pathogen panel negative -GI consult appreciated.  With IV antibiotics, stool volume seems to be slowing down.  Only 1 L of liquid bowel movement in last 24 hours compared to 3.3 L before that.  Abdomen less tender. -Continue Flexi-Seal. -I increased Questran from 3 times daily to 4 times daily today.  Multifocal pneumonia -CT chest on admission showed bibasilar consolidation consistent with multifocal pneumonia. -Antibiotics as stated above.  Acute respiratory failure with hypoxia -patient is requiring supplemental oxygen this morning -Multifactorial: Multifocal pneumonia, bilateral pleural effusion, low albumin, leading to possible pulmonary edema. -Wean down oxygen as tolerated.  Hyperosmolar hyperglycemic state Mild ketosis DKA - ruled out -Labs on admission showed significant hyperglycemia to more than 1000, mildly  elevated b-hydroxybutyrate, elevated anion gap but normal bicarbonate and normal pH.  Calculated serum osmolality was high at 350. -Patient was admitted on insulin drip.  Adequately hydrated.  Eventually switched to subcutaneous insulin.  Type 2 diabetes mellitus -A1c more than 15.5 on 9/30 -Currently on subcutaneous insulin.  Diabetes care coordinator consult appreciated.   -Continue Levemir to 15 units daily and NovoLog 2 units 3 times daily Premeal.  Continue sliding scale insulin with checks.   Recent Labs  Lab 03/21/20 0826 03/21/20 1148 03/21/20 1617 03/21/20 2117 03/22/20 0801  GLUCAP 230* 202* 120* 83 97   Moderate protein calorie malnutrition Hypoalbuminemia -Albumin level less than 2.  Low albumin level is contributing to third spacing of fluid evidenced as pedal edema as well as pleural effusion.  -Nutrition consult appreciated. -Nutrition Status: Nutrition Problem: Moderate Malnutrition Etiology: acute illness, diarrhea, other (see comment) (persistent hyperglycemia) Signs/Symptoms: mild fat depletion, mild muscle depletion Interventions: MVI, Other (Comment) (Prosource Plus)  AKI -Presented with an elevated creatinine 1.75.  Improved to normal with IV hydration.   -Continue to monitor   Hypernatremia/hyponatremia -Corrected sodium level on admission was severely high at 153. -Adequately hydrated.  Sodium level is now heading to hyponatremia. -Continue to monitor.  Hypokalemia//hypomagnesemia hypophosphatemia -Level trend as below. -Phosphorus level remains low at on morning labs today.   -Continue scheduled potassium phosphate as well as 1 dose of IV sodium phosphate today. Recent Labs  Lab 03/19/20 0500 03/20/20 0604 03/21/20 0500 03/22/20 0648  K 3.8 4.5 4.5 4.2  MG  --  1.4* 1.9 2.0  PHOS  --  <1.0* 1.8* 2.1*   Acute metabolic acidosis -Sodium bicarb level seems to be trending down, down to 15 today. -Switch IV fluid to bicarbonate drip at 50 mill  per hour. -Continue to monitor.  Elevated liver enzymes -Likely related to chronic alcoholism and septic shock.  No evidence of liver cirrhosis in imaging. -Liver enzymes improving. Recent Labs  Lab 03/02/2020 2000 03/19/20 0500 03/20/20 0604 03/21/20 0500  AST 104* 157* 158* 121*  ALT 60* 62* 60* 52*  ALKPHOS 204* 145* 150* 156*  BILITOT 1.7* 2.0* 2.2* 2.6*  PROT 7.2 5.6* 5.7* 6.1*  ALBUMIN 2.3* 1.6* 1.6* 1.8*   Acute on chronic macrocytic anemia -Macrocytosis likely due to alcoholism. -Hemoccult positive but no gross bleeding. -Notable drop in hemoglobin to 7.6 today.  Likely dilutional as well. -Continue to monitor. Recent Labs    02/19/2020 2000 03/16/20 0000 03/16/20 0049 03/19/20 0500 03/19/20 0500 03/20/20 0604 03/20/20 0604 03/20/20 1920 03/20/20 1920 03/21/20 0500 03/22/20 0546  HGB 7.0*  --    < > 8.9*  --  9.3*  --  9.3*  --  9.6* 7.6*  MCV 105.9*  --    < > 99.6   < > 100.4*   < > 102.6*   < > 103.6* 105.5*  VITAMINB12  --  1,040*  --   --   --   --   --   --   --   --   --   FOLATE  --  >100.0  --   --   --   --   --   --   --   --   --  FERRITIN  --  1,990*  --   --   --   --   --   --   --   --   --   TIBC  --  113*  --   --   --   --   --   --   --   --   --   IRON  --  117  --   --   --   --   --   --   --   --   --   RETICCTPCT 1.5  --   --   --   --   --   --   --   --   --   --    < > = values in this interval not displayed.   Generalized weakness/lethargy -Multifactorial: Septic shock, profuse diarrhea, low electrolytes, poor oral intake. -Somnolent but wakes up and answer questions.  Not delirious. -Since abdominal pain is improving.  I would stop as needed morphine. -I strongly recommended patient to participate with physical therapy today.  Polysubstance abuse Chronic alcoholism -Needs to quit.  Counseled  Mobility: Encourage ambulation.  PT evaluation when able to participate Code Status:   Code Status: Full Code  Nutritional  status: Body mass index is 31.06 kg/m. Nutrition Problem: Moderate Malnutrition Etiology: acute illness, diarrhea, other (see comment) (persistent hyperglycemia) Signs/Symptoms: mild fat depletion, mild muscle depletion Diet Order            Diet Carb Modified Fluid consistency: Thin; Room service appropriate? Yes  Diet effective now                 DVT prophylaxis: enoxaparin (LOVENOX) injection 40 mg Start: 03/16/20 1000 SCDs Start: 03/16/20 0050   Antimicrobials:  IV Rocephin/IV Flagyl Fluid: Sodium bicarb at 50 mill per hour. Consultants: GI Family Communication:  None at bedside  Status is: Inpatient  Remains inpatient appropriate because has persistent diarrhea, lethargy.   Dispo: The patient is from: Home              Anticipated d/c is to: Home  Vs SNF              Anticipated d/c date is: > 3 days              Patient currently is not medically stable to d/c.   Infusions:  . albumin human    . cefTRIAXone (ROCEPHIN)  IV Stopped (03/21/20 2120)  . sodium bicarbonate (isotonic) 150 mEq in D5W 1000 mL infusion 50 mL/hr at 03/22/20 0924  . sodium phosphate  Dextrose 5% IVPB 30 mmol (03/22/20 0911)    Scheduled Meds: . (feeding supplement) PROSource Plus  30 mL Oral TID BM  . chlorhexidine  15 mL Mouth Rinse BID  . Chlorhexidine Gluconate Cloth  6 each Topical Daily  . cholestyramine light  4 g Oral QID  . enoxaparin (LOVENOX) injection  40 mg Subcutaneous Q24H  . Gerhardt's butt cream   Topical BID  . insulin aspart  0-5 Units Subcutaneous QHS  . insulin aspart  0-9 Units Subcutaneous TID WC  . insulin aspart  2 Units Subcutaneous TID WC  . insulin detemir  15 Units Subcutaneous Q24H  . metroNIDAZOLE  500 mg Oral Q8H  . multivitamin with minerals  1 tablet Oral Daily  . pantoprazole  40 mg Oral Daily  . phosphorus  500 mg Oral BID  . sodium chloride flush  10-40 mL  Intracatheter Q12H  . thiamine  100 mg Oral Daily    Antimicrobials: Anti-infectives  (From admission, onward)   Start     Dose/Rate Route Frequency Ordered Stop   03/20/20 2200  cefTRIAXone (ROCEPHIN) 2 g in sodium chloride 0.9 % 100 mL IVPB        2 g 200 mL/hr over 30 Minutes Intravenous Every 24 hours 03/20/20 1440     03/17/20 1415  ceFEPIme (MAXIPIME) 2 g in sodium chloride 0.9 % 100 mL IVPB  Status:  Discontinued        2 g 200 mL/hr over 30 Minutes Intravenous Every 8 hours 03/17/20 1410 03/20/20 1437   03/17/20 1415  linezolid (ZYVOX) tablet 600 mg  Status:  Discontinued        600 mg Oral Every 12 hours 03/17/20 1410 03/20/20 1437   03/17/20 1415  metroNIDAZOLE (FLAGYL) tablet 500 mg        500 mg Oral Every 8 hours 03/17/20 1410     03/16/20 1830  azithromycin (ZITHROMAX) 500 mg in sodium chloride 0.9 % 250 mL IVPB  Status:  Discontinued        500 mg 250 mL/hr over 60 Minutes Intravenous Every 24 hours 03/16/20 1804 03/17/20 1322   03/16/20 1830  cefTRIAXone (ROCEPHIN) 1 g in sodium chloride 0.9 % 100 mL IVPB  Status:  Discontinued        1 g 200 mL/hr over 30 Minutes Intravenous Every 24 hours 03/16/20 1804 03/17/20 1322   05-Oct-2019 2245  vancomycin (VANCOREADY) IVPB 2000 mg/400 mL        2,000 mg 200 mL/hr over 120 Minutes Intravenous  Once 05-Oct-2019 2242 03/16/20 0116   05-Oct-2019 2215  ceFEPIme (MAXIPIME) 2 g in sodium chloride 0.9 % 100 mL IVPB        2 g 200 mL/hr over 30 Minutes Intravenous  Once 05-Oct-2019 2202 05-Oct-2019 2302      PRN meds: dextrose, morphine injection, sodium chloride flush   Objective: Vitals:   03/22/20 0800 03/22/20 1000  BP: 119/84 128/81  Pulse: 89 97  Resp: 14 16  Temp: (!) 97.5 F (36.4 C)   SpO2: 96% 95%    Intake/Output Summary (Last 24 hours) at 03/22/2020 1156 Last data filed at 03/22/2020 0743 Gross per 24 hour  Intake 2112.16 ml  Output 2050 ml  Net 62.16 ml   Filed Weights   03/16/20 1157 03/21/20 1400  Weight: 72.6 kg 98.2 kg   Weight change:  Body mass index is 31.06 kg/m.   Physical Exam: General  exam: Lethargic look  Skin: No rashes, lesions or ulcers. HEENT: Atraumatic, normocephalic, supple neck, no obvious bleeding Lungs: Gurgly throat sound.  CVS: Regular rate and rhythm, no murmur GI/Abd soft, distended, mild tenderness mostly in the lower abdomen, bowel sound present CNS: Opens eyes on verbal command, knows he is in the hospital.  Slow to respond Psychiatry: Depressed look Extremities: 1+ pedal edema bilaterally.  No calf tenderness.  Data Review: I have personally reviewed the laboratory data and studies available.  Recent Labs  Lab 05-Oct-2019 2000 03/16/20 0049 03/19/20 0500 03/20/20 0604 03/20/20 1920 03/21/20 0500 03/22/20 0546  WBC 16.6*   < > 11.5* 12.8* 13.9* 16.2* 12.1*  NEUTROABS 12.1*  --   --   --   --  13.5* 9.3*  HGB 7.0*   < > 8.9* 9.3* 9.3* 9.6* 7.6*  HCT 21.6*   < > 26.3* 27.6* 27.7* 28.5* 23.1*  MCV 105.9*   < >  99.6 100.4* 102.6* 103.6* 105.5*  PLT 171   < > 89* 93* 109* 123* 128*   < > = values in this interval not displayed.   F/u labs ordered  Signed, Lorin Glass, MD [facility] 03/22/2020

## 2020-03-22 NOTE — Evaluation (Signed)
Physical Therapy Evaluation Patient Details Name: Roberto Rhodes MRN: [id number] DOB: 07/17/1960 Today's Date: 03/22/2020   History of Present Illness  59 year old male with PMH significant for diabetes mellitus type 2,, HTN polysubstance abuse, noncompliance with medications and admitted for septic shock secondary to multifocal pneumonia and colitis  Clinical Impression  Pt admitted with above diagnosis.  Pt currently with functional limitations due to the deficits listed below (see PT Problem List). Pt will benefit from skilled PT to increase their independence and safety with mobility to allow discharge to the venue listed below.  Pt presents with fatigue and generalized weakness.  Pt required a little encouragement for OOB however agreeable for transfer to recliner (to eat lunch).  Pt currently on 5L O2 [place] and Spo2 95% after transferring however pt with audible breathing sounds and coughing.  Pt encouraged to expectorate as able and provided bag and tissues.  Pt would benefit from SNF upon d/c at this time however will continue to update notes as pt progresses.     Follow Up Recommendations SNF    Equipment Recommendations  None recommended by PT    Recommendations for Other Services       Precautions / Restrictions Precautions Precautions: Fall Precaution Comments: rectal tube, monitor sats      Mobility  Bed Mobility Overal bed mobility: Needs Assistance Bed Mobility: Supine to Sit     Supine to sit: Min assist;HOB elevated     General bed mobility comments: pt able to slowly bring LEs over EOB, assist for trunk upright  Transfers Overall transfer level: Needs assistance Equipment used: 1 person hand held assist Transfers: Squat Pivot Transfers     Squat pivot transfers: Mod assist     General transfer comment: pt declined walker however requiring some assist upon initial attempt to rise so moved recliner close and pt used armrest, assist to rise and control descent;  SPO2 96% on 5L O2 [place]  Ambulation/Gait                Stairs            Wheelchair Mobility    Modified Rankin (Stroke Patients Only)       Balance Overall balance assessment: Needs assistance         Standing balance support: Bilateral upper extremity supported Standing balance-Leahy Scale: Poor Standing balance comment: unable to stand fully erect, requiring UE support                             Pertinent Vitals/Pain Pain Assessment: No/denies pain    Home Living Family/patient expects to be discharged to:: Private residence Living Arrangements: Other relatives     Home Access: Stairs to enter   Secretary/administrator of Steps: 6 Home Layout: One level Home Equipment: Environmental consultant - 2 wheels      Prior Function Level of Independence: Independent               Hand Dominance        Extremity/Trunk Assessment        Lower Extremity Assessment Lower Extremity Assessment: Generalized weakness    Cervical / Trunk Assessment Cervical / Trunk Assessment: Normal  Communication   Communication: No difficulties  Cognition Arousal/Alertness: Awake/alert Behavior During Therapy: WFL for tasks assessed/performed Overall Cognitive Status: Within Functional Limits for tasks assessed  General Comments      Exercises     Assessment/Plan    PT Assessment Patient needs continued PT services  PT Problem List Decreased strength;Decreased mobility;Decreased balance;Decreased knowledge of use of DME;Cardiopulmonary status limiting activity;Decreased activity tolerance       PT Treatment Interventions DME instruction;Gait training;Balance training;Functional mobility training;Therapeutic activities;Patient/family education;Therapeutic exercise    PT Goals (Current goals can be found in the Care Plan section)  Acute Rehab PT Goals PT Goal Formulation: With patient Time For Goal  Achievement: 04/05/20 Potential to Achieve Goals: Good    Frequency Min 3X/week   Barriers to discharge        Co-evaluation               AM-PAC PT "6 Clicks" Mobility  Outcome Measure Help needed turning from your back to your side while in a flat bed without using bedrails?: A Lot Help needed moving from lying on your back to sitting on the side of a flat bed without using bedrails?: A Lot Help needed moving to and from a bed to a chair (including a wheelchair)?: A Lot Help needed standing up from a chair using your arms (e.g., wheelchair or bedside chair)?: A Lot Help needed to walk in hospital room?: A Lot Help needed climbing 3-5 steps with a railing? : Total 6 Click Score: 11    End of Session Equipment Utilized During Treatment: Gait belt;Oxygen Activity Tolerance: Patient limited by fatigue Patient left: in chair;with chair alarm set;with call bell/phone within reach Nurse Communication: Mobility status PT Visit Diagnosis: Other abnormalities of gait and mobility (R26.89)    Time: 0258-5277 PT Time Calculation (min) (ACUTE ONLY): 25 min   Charges:   PT Evaluation $PT Eval Moderate Complexity: 1 Mod        Kati PT, DPT Acute Rehabilitation Services Pager: [phone] Office: [phone]  Maida Sale E 03/22/2020, 3:39 PM

## 2020-03-23 ENCOUNTER — Inpatient Hospital Stay (HOSPITAL_COMMUNITY): Payer: Self-pay

## 2020-03-23 LAB — GLUCOSE, CAPILLARY
Glucose-Capillary: 157 mg/dL — ABNORMAL HIGH (ref 70–99)
Glucose-Capillary: 130 mg/dL — ABNORMAL HIGH (ref 70–99)
Glucose-Capillary: 58 mg/dL — ABNORMAL LOW (ref 70–99)
Glucose-Capillary: 73 mg/dL (ref 70–99)
Glucose-Capillary: 103 mg/dL — ABNORMAL HIGH (ref 70–99)

## 2020-03-23 LAB — CBC WITH DIFFERENTIAL/PLATELET
Abs Immature Granulocytes: 0.07 10*3/uL (ref 0.00–0.07)
Basophils Absolute: 0.1 10*3/uL (ref 0.0–0.1)
Basophils Relative: 0 %
Eosinophils Absolute: 0.1 10*3/uL (ref 0.0–0.5)
Eosinophils Relative: 0 %
HCT: 24.9 % — ABNORMAL LOW (ref 39.0–52.0)
Hemoglobin: 8.2 g/dL — ABNORMAL LOW (ref 13.0–17.0)
Immature Granulocytes: 1 %
Lymphocytes Relative: 10 %
Lymphs Abs: 1.3 10*3/uL (ref 0.7–4.0)
MCH: 35.2 pg — ABNORMAL HIGH (ref 26.0–34.0)
MCHC: 32.9 g/dL (ref 30.0–36.0)
MCV: 106.9 fL — ABNORMAL HIGH (ref 80.0–100.0)
Monocytes Absolute: 0.6 10*3/uL (ref 0.1–1.0)
Monocytes Relative: 5 %
Neutro Abs: 11.5 10*3/uL — ABNORMAL HIGH (ref 1.7–7.7)
Neutrophils Relative %: 84 %
Platelets: 177 10*3/uL (ref 150–400)
RBC: 2.33 MIL/uL — ABNORMAL LOW (ref 4.22–5.81)
RDW: 20.8 % — ABNORMAL HIGH (ref 11.5–15.5)
WBC: 13.6 10*3/uL — ABNORMAL HIGH (ref 4.0–10.5)
nRBC: 0.7 % — ABNORMAL HIGH (ref 0.0–0.2)

## 2020-03-23 LAB — BLOOD GAS, ARTERIAL
Acid-base deficit: 3.5 mmol/L — ABNORMAL HIGH (ref 0.0–2.0)
Bicarbonate: 20.7 mmol/L (ref 20.0–28.0)
O2 Saturation: 85.8 %
Patient temperature: 98.1
pCO2 arterial: 35.4 mmHg (ref 32.0–48.0)
pH, Arterial: 7.382 (ref 7.350–7.450)
pO2, Arterial: 60.4 mmHg — ABNORMAL LOW (ref 83.0–108.0)

## 2020-03-23 LAB — BASIC METABOLIC PANEL
Anion gap: 10 (ref 5–15)
BUN: 9 mg/dL (ref 6–20)
CO2: 16 mmol/L — ABNORMAL LOW (ref 22–32)
Calcium: 7.4 mg/dL — ABNORMAL LOW (ref 8.9–10.3)
Chloride: 113 mmol/L — ABNORMAL HIGH (ref 98–111)
Creatinine, Ser: 0.57 mg/dL — ABNORMAL LOW (ref 0.61–1.24)
GFR calc non Af Amer: >60 mL/min (ref >60)
Glucose, Bld: 182 mg/dL — ABNORMAL HIGH (ref 70–99)
Potassium: 3.6 mmol/L (ref 3.5–5.1)
Sodium: 139 mmol/L (ref 135–145)

## 2020-03-23 LAB — HEPATIC FUNCTION PANEL
ALT: 37 U/L (ref 0–44)
AST: 87 U/L — ABNORMAL HIGH (ref 15–41)
Albumin: 1.8 g/dL — ABNORMAL LOW (ref 3.5–5.0)
Alkaline Phosphatase: 142 U/L — ABNORMAL HIGH (ref 38–126)
Bilirubin, Direct: 1 mg/dL — ABNORMAL HIGH (ref 0.0–0.2)
Indirect Bilirubin: 1.3 mg/dL — ABNORMAL HIGH (ref 0.3–0.9)
Total Bilirubin: 2.3 mg/dL — ABNORMAL HIGH (ref 0.3–1.2)
Total Protein: 5.9 g/dL — ABNORMAL LOW (ref 6.5–8.1)

## 2020-03-23 LAB — PHOSPHORUS: Phosphorus: 2.5 mg/dL (ref 2.5–4.6)

## 2020-03-23 LAB — MAGNESIUM: Magnesium: 1.8 mg/dL (ref 1.7–2.4)

## 2020-03-23 MED ORDER — OXYCODONE HCL 5 MG PO TABS
5.0000 mg | ORAL_TABLET | Freq: Four times a day (QID) | ORAL | Status: DC | PRN
  Administered 2020-03-23 (×3): 5 mg via ORAL
  Filled 2020-03-23 (×3): qty 1

## 2020-03-23 MED ORDER — INSULIN DETEMIR 100 UNIT/ML ~~LOC~~ SOLN
12.0000 [IU] | SUBCUTANEOUS | Status: DC
  Filled 2020-03-23: qty 0.12

## 2020-03-23 MED ORDER — LEVALBUTEROL HCL 0.63 MG/3ML IN NEBU: 0.6300 mg | INHALATION_SOLUTION | Freq: Three times a day (TID) | RESPIRATORY_TRACT | Status: DC | PRN

## 2020-03-23 NOTE — Progress Notes (Signed)
PROGRESS NOTE  Sibyl ParrDan Mccreedy  DOB: 05/19/1961  PCP: Aviva KluverPcp, No HQI:[id number]  DOA: 2020/05/06  LOS: 7 days   Chief Complaint  Patient presents with  . Weakness  . hyperglycemic   Brief narrative: Patient is 59 year old male with PMH significant for diabetes mellitus type 2,, HTN polysubstance abuse, noncompliance with medications.  9/30, patient was brought to the ED from home with extreme weakness. He had generalized weakness for 3 to 4 days and intermittent diarrhea for 3 weeks.  In the ED, his blood pressure dropped less than 90s. Labs showed significant hyperglycemia to more than 1000, mildly elevated b-hydroxybutyrate, elevated anion gap but normal bicarbonate and normal pH.  Calculated serum osmolality was high at 350. Corrected sodium level was 153, creatinine 1.75.  Lactic acid level was elevated to 6.4 Chest x-ray showed mild right infrahilar atelectasis and/or infiltrate.   CT chest showed bibasilar consolidation consistent with multifocal pneumonia. C abdomen pelvis showed wall thickening in the rectosigmoid in a circumferential fashion with surrounding inflammatory change consistent with focal colitis. He was admitted to ICU in a state of septic shock.  He was started on broad-spectrum antibiotics, aggressive IV hydration, IV insulin drip and electrolyte replacements.  Septic shock parameters as well as lactic acid level gradually improved.  Patient is off pressors since 10/4. Glucose level have stabilized on insulin drip and is currently on subcutaneous insulin.  However, patient continues to have persistent liquid bowel movement. 10/3, repeat CT scan of abdomen and pelvis showed progressive infectious/inflammatory colitis, now particularly involving the hepatic flexure.   Subjective: Patient was seen and examined this morning. Remains lethargic.  Gurgling sound from throat.  Still volume seems to be slowing down, only 300 mL in last 24 hours.  Abdominal tenderness  improved.  Assessment/Plan: Septic shock - POA -secondary to multifocal pneumonia and colitis.  Management of individual issues as below.  -Currently on IV Rocephin and IV Flagyl. -WBC, lactic acid and procalcitonin level are improving.  Trend as below. -Continue to monitor. Recent Labs  Lab 03/17/20 0948 03/17/20 1343 03/18/20 0528 03/18/20 0529 03/18/20 0529 03/18/20 0741 03/18/20 1310 03/19/20 0500 03/19/20 0500 03/20/20 0604 03/20/20 1920 03/21/20 0500 03/21/20 0915 03/22/20 0546 03/23/20 0556  WBC  --    < >  --  12.5*   < >  --   --  11.5*   < > 12.8* 13.9* 16.2*  --  12.1* 13.6*  LATICACIDVEN 4.0*   < >   < >  --   --   --  2.7* 2.8*  --  1.9  --   --  2.1* 1.3  --   PROCALCITON 0.94  --   --  0.81  --  0.79  --  0.74  --  1.07  --   --   --   --   --    < > = values in this interval not displayed.   Acute colitis -Per history, patient had intermittent diarrhea for 3 weeks. -CT abdomen on admission showed colitis of the rectosigmoid area. -10/3, repeat CT scan of abdomen and pelvis showed progressive infectious/inflammatory colitis, now particularly involving the hepatic flexure.  -C. difficile negative.  GI pathogen panel negative -GI consult appreciated.  With IV antibiotics, stool volume seems to be slowing down.  Only 1 L of liquid bowel movement in last 24 hours compared to 3.3 L before that.  Abdomen less tender. -Continue Flexi-Seal. -I increased Questran from 3 times daily to 4 times daily today.  Multifocal pneumonia -CT chest on admission showed bibasilar consolidation consistent with multifocal pneumonia. -Antibiotics as stated above.  Acute respiratory failure with hypoxia -patient is requiring supplemental oxygen this morning -Multifactorial: Multifocal pneumonia, bilateral pleural effusion, low albumin, leading to possible pulmonary edema. -Wean down oxygen as tolerated.  Hyperosmolar hyperglycemic state Mild ketosis DKA - ruled out -Labs on  admission showed significant hyperglycemia to more than 1000, mildly elevated b-hydroxybutyrate, elevated anion gap but normal bicarbonate and normal pH.  Calculated serum osmolality was high at 350. -Patient was admitted on insulin drip.  Adequately hydrated.  Eventually switched to subcutaneous insulin.  Type 2 diabetes mellitus -A1c more than 15.5 on 9/30 -Currently on subcutaneous insulin.  Diabetes care coordinator consult appreciated.   -Continue Levemir to 15 units daily and NovoLog 2 units 3 times daily Premeal.  Continue sliding scale insulin with checks.   Recent Labs  Lab 03/22/20 0801 03/22/20 1208 03/22/20 1643 03/22/20 2147 03/23/20 0746  GLUCAP 97 119* 122* 134* 157*   Moderate protein calorie malnutrition Hypoalbuminemia -Albumin level less than 2.  Low albumin level is contributing to third spacing of fluid evidenced as pedal edema as well as pleural effusion.  -Nutrition consult appreciated.  I gave him a dose of IV albumin on 10/6. -Nutrition Status: Nutrition Problem: Moderate Malnutrition Etiology: acute illness, diarrhea, other (see comment) (persistent hyperglycemia) Signs/Symptoms: mild fat depletion, mild muscle depletion Interventions: MVI, Other (Comment) (Prosource Plus)  AKI -Presented with an elevated creatinine 1.75.  Improved to normal with IV hydration.   -Continue to monitor   Hypernatremia/hyponatremia -Corrected sodium level on admission was severely high at 153. -Adequately hydrated.  Sodium level is now heading to hyponatremia. -Continue to monitor.  Hypokalemia//hypomagnesemia hypophosphatemia -Level trend as below. -Phosphorus level remains low at on morning labs today.   -Continue scheduled potassium phosphate as well as 1 dose of IV sodium phosphate today. Recent Labs  Lab 03/20/20 0604 03/21/20 0500 03/22/20 0546 03/22/20 0648 03/23/20 0556  K 4.5 4.5 QUESTIONABLE RESULTS, RECOMMEND RECOLLECT TO VERIFY 4.2 3.6  MG 1.4* 1.9 1.7  2.0 1.8  PHOS <1.0* 1.8* 1.9* 2.1* 2.5   Acute metabolic acidosis -I have ordered sodium bicarb drip but he seems to be retaining fluid now.  I will stop IV fluid.  Encourage oral appetite.  Elevated liver enzymes -Likely related to chronic alcoholism and septic shock.  No evidence of liver cirrhosis in imaging. -Liver enzymes improving. Recent Labs  Lab 03/19/20 0500 03/20/20 0604 03/21/20 0500 03/23/20 0556  AST 157* 158* 121* 87*  ALT 62* 60* 52* 37  ALKPHOS 145* 150* 156* 142*  BILITOT 2.0* 2.2* 2.6* 2.3*  PROT 5.6* 5.7* 6.1* 5.9*  ALBUMIN 1.6* 1.6* 1.8* 1.8*   Acute on chronic macrocytic anemia -Macrocytosis likely due to alcoholism. -Hemoccult positive but no gross bleeding. -Notable drop in hemoglobin to 7.6 today.  Likely dilutional as well. -Continue to monitor. Recent Labs    04/03/2020 2000 03/16/20 0000 03/16/20 0049 03/20/20 0604 03/20/20 0604 03/20/20 1920 03/20/20 1920 03/21/20 0500 03/21/20 0500 03/22/20 0546 03/23/20 0556  HGB 7.0*  --    < > 9.3*  --  9.3*  --  9.6*  --  7.6* 8.2*  MCV 105.9*  --    < > 100.4*   < > 102.6*   < > 103.6*   < > 105.5* 106.9*  VITAMINB12  --  1,040*  --   --   --   --   --   --   --   --   --  FOLATE  --  >100.0  --   --   --   --   --   --   --   --   --   FERRITIN  --  1,990*  --   --   --   --   --   --   --   --   --   TIBC  --  113*  --   --   --   --   --   --   --   --   --   IRON  --  117  --   --   --   --   --   --   --   --   --   RETICCTPCT 1.5  --   --   --   --   --   --   --   --   --   --    < > = values in this interval not displayed.   Generalized weakness/lethargy -Multifactorial: Septic shock, profuse diarrhea, low electrolytes, poor oral intake. -Somnolent but wakes up and answer questions.  Not delirious. -Stop morphine today. -I strongly recommended patient to participate with physical therapy today.  Polysubstance abuse Chronic alcoholism -Needs to quit.  Counseled.  Mobility: Encourage  ambulation.  Continue to work with PT. Code Status:   Code Status: Full Code  Nutritional status: Body mass index is 31.06 kg/m. Nutrition Problem: Moderate Malnutrition Etiology: acute illness, diarrhea, other (see comment) (persistent hyperglycemia) Signs/Symptoms: mild fat depletion, mild muscle depletion Diet Order            Diet Carb Modified Fluid consistency: Thin; Room service appropriate? Yes  Diet effective now                 DVT prophylaxis: enoxaparin (LOVENOX) injection 40 mg Start: 03/16/20 1000 SCDs Start: 03/16/20 0050   Antimicrobials:  IV Rocephin/IV Flagyl Fluid: Stop IV fluid today Consultants: GI Family Communication:  None at bedside.  I called and updated patient's sister-in-law Ms. Donah Driver.  She states that patient has not been taking care of him since his mother died in 10/22/08.  He seems depressed and under influence of multiple drugs and alcohol.  He has been hospitalized multiple times with different types of complications.  Pending palliative care consultation.  Status is: Inpatient  Remains inpatient appropriate because patient has persistent diarrhea, lethargy.  Dispo: The patient is from: Home              Anticipated d/c is to: Home  Vs SNF              Anticipated d/c date is: > 3 days              Patient currently is not medically stable to d/c.  Infusions:  . cefTRIAXone (ROCEPHIN)  IV Stopped (03/22/20 2230)    Scheduled Meds: . (feeding supplement) PROSource Plus  30 mL Oral TID BM  . chlorhexidine  15 mL Mouth Rinse BID  . Chlorhexidine Gluconate Cloth  6 each Topical Daily  . cholestyramine light  4 g Oral QID  . enoxaparin (LOVENOX) injection  40 mg Subcutaneous Q24H  . Gerhardt's butt cream   Topical BID  . insulin aspart  0-5 Units Subcutaneous QHS  . insulin aspart  0-9 Units Subcutaneous TID WC  . insulin aspart  2 Units Subcutaneous TID WC  . insulin detemir  15 Units Subcutaneous Q24H  .  metroNIDAZOLE  500 mg Oral  Q8H  . multivitamin with minerals  1 tablet Oral Daily  . pantoprazole  40 mg Oral Daily  . phosphorus  500 mg Oral BID  . sodium chloride flush  10-40 mL Intracatheter Q12H  . thiamine  100 mg Oral Daily    Antimicrobials: Anti-infectives (From admission, onward)   Start     Dose/Rate Route Frequency Ordered Stop   03/20/20 2200  cefTRIAXone (ROCEPHIN) 2 g in sodium chloride 0.9 % 100 mL IVPB        2 g 200 mL/hr over 30 Minutes Intravenous Every 24 hours 03/20/20 1440     03/17/20 1415  ceFEPIme (MAXIPIME) 2 g in sodium chloride 0.9 % 100 mL IVPB  Status:  Discontinued        2 g 200 mL/hr over 30 Minutes Intravenous Every 8 hours 03/17/20 1410 03/20/20 1437   03/17/20 1415  linezolid (ZYVOX) tablet 600 mg  Status:  Discontinued        600 mg Oral Every 12 hours 03/17/20 1410 03/20/20 1437   03/17/20 1415  metroNIDAZOLE (FLAGYL) tablet 500 mg        500 mg Oral Every 8 hours 03/17/20 1410     03/16/20 1830  azithromycin (ZITHROMAX) 500 mg in sodium chloride 0.9 % 250 mL IVPB  Status:  Discontinued        500 mg 250 mL/hr over 60 Minutes Intravenous Every 24 hours 03/16/20 1804 03/17/20 1322   03/16/20 1830  cefTRIAXone (ROCEPHIN) 1 g in sodium chloride 0.9 % 100 mL IVPB  Status:  Discontinued        1 g 200 mL/hr over 30 Minutes Intravenous Every 24 hours 03/16/20 1804 03/17/20 1322   02/22/2020 2245  vancomycin (VANCOREADY) IVPB 2000 mg/400 mL        2,000 mg 200 mL/hr over 120 Minutes Intravenous  Once 03/16/2020 2242 03/16/20 0116   02/16/2020 2215  ceFEPIme (MAXIPIME) 2 g in sodium chloride 0.9 % 100 mL IVPB        2 g 200 mL/hr over 30 Minutes Intravenous  Once 03/06/2020 2202 02/28/2020 2302      PRN meds: dextrose, morphine injection, oxyCODONE, sodium chloride flush   Objective: Vitals:   03/23/20 0800 03/23/20 1000  BP: 113/66 (!) 105/52  Pulse: 78 88  Resp: 14 15  Temp: (!) 97.5 F (36.4 C)   SpO2: 99% 96%    Intake/Output Summary (Last 24 hours) at 03/23/2020  1140 Last data filed at 03/23/2020 1006 Gross per 24 hour  Intake 1312.45 ml  Output 1675 ml  Net -362.55 ml   Filed Weights   03/16/20 1157 03/21/20 1400  Weight: 72.6 kg 98.2 kg   Weight change:  Body mass index is 31.06 kg/m.   Physical Exam: General exam: Lethargic look.  Not in physical distress Skin: No rashes, lesions or ulcers. HEENT: Atraumatic, normocephalic, supple neck, no obvious bleeding Lungs: Gurgly throat sound.  CVS: Regular rate and rhythm, no murmur GI/Abd soft, distended, nontender abdomen, bowel sound present CNS: Opens eyes on verbal command.  Knows he is in the hospital.  Slow to respond. Psychiatry: Depressed look Extremities: 1+ pedal edema bilaterally.  No calf tenderness.  Data Review: I have personally reviewed the laboratory data and studies available.  Recent Labs  Lab 03/20/20 0604 03/20/20 1920 03/21/20 0500 03/22/20 0546 03/23/20 0556  WBC 12.8* 13.9* 16.2* 12.1* 13.6*  NEUTROABS  --   --  13.5* 9.3* 11.5*  HGB 9.3* 9.3* 9.6* 7.6* 8.2*  HCT 27.6* 27.7* 28.5* 23.1* 24.9*  MCV 100.4* 102.6* 103.6* 105.5* 106.9*  PLT 93* 109* 123* 128* 177   F/u labs ordered  Signed, Lorin Glass, MD [facility] 03/23/2020

## 2020-03-23 NOTE — Progress Notes (Signed)
Sugar 57 gave. 240 Ml orange juice.  And notified MD.  Has stopped D5% per MD but no adjustment to insulin coverage to this point

## 2020-03-23 NOTE — Progress Notes (Signed)
[facility] Gastroenterology Progress Note  Demontae Antunes 59 y.o. November 28, 1960  CC:  Colitis  Subjective: Patient sleeping comfortably.  ROS : Not obtained as patient was sleeping  Objective: Vital signs in last 24 hours: Vitals:   03/23/20 0600 03/23/20 0800  BP: 135/77 113/66  Pulse: 80 78  Resp: 19 14  Temp:  (!) 97.5 F (36.4 C)  SpO2: 93% 99%    Physical Exam:  General:  Sleeping, no acute distress  Head:  Normocephalic, without obvious abnormality, atraumatic  Eyes:  Anicteric sclera, EOMs intact   Lungs:   No respiratory distress, lungs clear to auscultation, though some throat/upper airway sounds present  Heart:  Regular rate and rhythm, S1, S2 normal  Abdomen:   Soft and nondistended, flexi seal with small amount of brown liquid stool  Extremities: +Bilateral pedal edema  Pulses: 2+ and symmetric    Lab Results: Recent Labs    03/22/20 0648 03/23/20 0556  NA 133* 139  K 4.2 3.6  CL 111 113*  CO2 15* 16*  GLUCOSE 117* 182*  BUN 9 9  CREATININE 0.65 0.57*  CALCIUM 6.9* 7.4*  MG 2.0 1.8  PHOS 2.1* 2.5   Recent Labs    03/21/20 0500 03/23/20 0556  AST 121* 87*  ALT 52* 37  ALKPHOS 156* 142*  BILITOT 2.6* 2.3*  PROT 6.1* 5.9*  ALBUMIN 1.8* 1.8*   Recent Labs    03/22/20 0546 03/23/20 0556  WBC 12.1* 13.6*  NEUTROABS 9.3* 11.5*  HGB 7.6* 8.2*  HCT 23.1* 24.9*  MCV 105.5* 106.9*  PLT 128* 177   No results for input(s): LABPROT, INR in the last 72 hours.  Assessment: Colitis, presumably infectious -C diff negative, GI pathogen panel negative, though may be false negative due to antibiotic therapy. -Stool output decreasing, only 300 cc in last 24 hours  GERD: on daily PPI at home  Pneumonia, Septic shock  Transaminitis, most likely from septic shock  Macrocytic anemia: Hgb 8.2 compared to 7.6 yesterday, though no signs of GI bleeding  DM type 2  Plan: Continue empiric antibiotics.  We will continue to follow clinically.  Protonix PO  daily for GERD.  [facility] will follow.  Edrick Kins PA-C 03/23/2020, 9:46 AM  Contact #  [phone]

## 2020-03-23 NOTE — Progress Notes (Signed)
Occupational Therapy Evaluation  Patient with functional deficits listed below impacting safety and independence with self care. Patient is alert and oriented x3 however making non sensical statements such as "is the phone with the company working?" RN reports patient is more clear today but has been making similar statements to the RN. Patient requiring mod cues for safety with body mechanics to sit to stand from recliner and min A x2. Patient initially pushing walker too far forward and wide BOS, instruct patient to return to sitting. Patient stood second time for peri care due to small amount of BM in chair, patient requiring repeated cues for hand placement and min A x2 to power up to standing. Recommend continued acute OT services in order to facilitate D/C to venue listed below.   03/23/20 1430  OT Visit Information  Last OT Received On 03/23/20  Assistance Needed +2  History of Present Illness Roberto Rhodes with PMH significant for diabetes mellitus type 2,, HTN polysubstance abuse, noncompliance with medications and admitted for septic shock secondary to multifocal pneumonia and colitis  Precautions  Precautions Fall  Precaution Comments monitor sats  Restrictions  Weight Bearing Restrictions No  Home Living  Family/patient expects to be discharged to: Private residence  Living Arrangements Other relatives (step father)  Available Help at Discharge Family  Type of Home House  Home Access Stairs to enter  Entrance Stairs-Number of Steps 6  Home Layout One level  Bathroom Shower/Tub Walk-in IT trainer - 2 wheels;Shower seat;Grab bars - tub/shower  Prior Function  Level of Independence Independent  Communication  Communication No difficulties  Pain Assessment  Pain Assessment No/denies pain  Cognition  Arousal/Alertness Awake/alert  Behavior During Therapy WFL for tasks assessed/performed  Overall Cognitive Status No  Roberto Rhodes present to determine baseline cognitive functioning  General Comments patient A/O x3 however making statements "is the phone with the company working" and other nonsensical statements, follows directions with increased time  Upper Extremity Assessment  Upper Extremity Assessment Generalized weakness  Lower Extremity Assessment  Lower Extremity Assessment Defer to PT evaluation  Cervical / Trunk Assessment  Cervical / Trunk Assessment Normal  ADL  Overall ADL's  Needs assistance/impaired  Grooming Sitting;Supervision/safety  Upper Body Bathing Supervision/ safety;Sitting  Lower Body Bathing Maximal assistance;Sitting/lateral leans;Sit to/from stand  Upper Body Dressing  Supervision/safety;Sitting  Lower Body Dressing Maximal assistance;Sitting/lateral leans;Sit to/from stand  Lower Body Dressing Details (indicate cue type and reason) decreased activity tolerance and sitting balance in recliner, sits back quickly when cued to sit upright to attempt doffing sock  Toilet Transfer +2 for physical assistance;+2 for safety/equipment;Minimal assistance;Cueing for safety;Cueing for sequencing  Toilet Transfer Details (indicate cue type and reason) sit to stand from recliner twice, decreased coordination with wide base of support, decreased safety awareness, stability  Toileting- Clothing Manipulation and Hygiene Total assistance;Sit to/from stand  Functional mobility during ADLs Minimal assistance;+2 for physical assistance;+2 for safety/equipment;Cueing for safety;Cueing for sequencing;Rolling walker  General ADL Comments patient requiring increased assistance with self care due to decreased safety awareness, coordination, balance, strength, activity tolerance  Bed Mobility  General bed mobility comments in recliner  Transfers  Overall transfer level Needs assistance  Equipment used Rolling walker (2 wheeled)  Transfers Sit to/from Stand  Sit to Stand Min assist;+2 physical  assistance;+2 safety/equipment  General transfer comment patient requires verbal cues to push from chair to stand, min A x2 to power up to standing. patient performed twice from recliner, patient  with wide base of support and decreased coordination, activity tolerance  Balance  Overall balance assessment Needs assistance  Standing balance support Bilateral upper extremity supported  Standing balance-Leahy Scale Poor  General Comments  General comments (skin integrity, edema, etc.) patient desaturating to mid 80s on room air, donned 2L patient maintain in low 90s. RN aware  OT - End of Session  Equipment Utilized During Treatment Oxygen;Rolling walker;Gait belt  Activity Tolerance Patient tolerated treatment well  Patient left in chair;with call bell/phone within reach;with chair alarm set  Nurse Communication Mobility status  OT Assessment  OT Recommendation/Assessment Patient needs continued OT Services  OT Visit Diagnosis Unsteadiness on feet (R26.81);Other abnormalities of gait and mobility (R26.89);Muscle weakness (generalized) (M62.81)  OT Problem List Decreased strength;Decreased activity tolerance;Impaired balance (sitting and/or standing);Decreased coordination;Decreased safety awareness;Decreased cognition;Obesity  OT Plan  OT Frequency (ACUTE ONLY) Min 2X/week  OT Treatment/Interventions (ACUTE ONLY) Self-care/ADL training;Therapeutic exercise;DME and/or AE instruction;Therapeutic activities;Cognitive remediation/compensation;Patient/family education;Balance training  AM-PAC OT "6 Clicks" Daily Activity Outcome Measure (Version 2)  Help from another person eating meals? 4  Help from another person taking care of personal grooming? 3  Help from another person toileting, which includes using toliet, bedpan, or urinal? 2  Help from another person bathing (including washing, rinsing, drying)? 2  Help from another person to put on and taking off regular upper body clothing? 3  Help  from another person to put on and taking off regular lower body clothing? 2  6 Click Score 16  OT Recommendation  Follow Up Recommendations SNF;Supervision/Assistance - 24 hour  OT Equipment None recommended by OT  Individuals Consulted  Consulted and Agree with Results and Recommendations Patient  Acute Rehab OT Goals  Patient Stated Goal get stronger  OT Goal Formulation With patient  Time For Goal Achievement 04/06/20  Potential to Achieve Goals Good  OT Time Calculation  OT Start Time (ACUTE ONLY) 1205  OT Stop Time (ACUTE ONLY) 1229  OT Time Calculation (min) 24 min  OT General Charges  $OT Visit 1 Visit  OT Evaluation  $OT Eval Moderate Complexity 1 Mod  OT Treatments  $Self Care/Home Management  8-22 mins  Written Expression  Dominant Hand Right   Marlyce Huge OT OT pager: [phone]

## 2020-03-23 NOTE — TOC Progression Note (Signed)
Transition of Care [facility]) - Progression Note    Patient Details  Name: Roberto Rhodes MRN: [id number] Date of Birth: Apr 15, 1961  [facility] CM/SW Contact  Golda Acre, RN Phone Number: 03/23/2020, 9:26 AM  Clinical Narrative:    Patient sent out to area snf for consideration , has no insurance will be difficult to place.will talk to patient about choice temporarily 101, iv rocephin, iv nahco3 drip, hepatic function elevated, Will follow for placment and progression.  Expected Discharge Plan: Home/Self Care Barriers to Discharge: Continued Medical Work up  Expected Discharge Plan and Services Expected Discharge Plan: Home/Self Care   Discharge Planning Services: CM Consult   Living arrangements for the past 2 months: Single Family Home                                       Social Determinants of Health (SDOH) Interventions    Readmission Risk Interventions No flowsheet data found.

## 2020-03-24 DIAGNOSIS — Z7189 Other specified counseling: Secondary | ICD-10-CM

## 2020-03-24 DIAGNOSIS — Z515 Encounter for palliative care: Secondary | ICD-10-CM

## 2020-03-24 DIAGNOSIS — T17908A Unspecified foreign body in respiratory tract, part unspecified causing other injury, initial encounter: Secondary | ICD-10-CM

## 2020-03-24 LAB — GLUCOSE, CAPILLARY
Glucose-Capillary: 44 mg/dL — CL (ref 70–99)
Glucose-Capillary: 102 mg/dL — ABNORMAL HIGH (ref 70–99)
Glucose-Capillary: 102 mg/dL — ABNORMAL HIGH (ref 70–99)

## 2020-03-24 LAB — CBC
HCT: 25.8 % — ABNORMAL LOW (ref 39.0–52.0)
Hemoglobin: 8.5 g/dL — ABNORMAL LOW (ref 13.0–17.0)
MCH: 35 pg — ABNORMAL HIGH (ref 26.0–34.0)
MCHC: 32.9 g/dL (ref 30.0–36.0)
MCV: 106.2 fL — ABNORMAL HIGH (ref 80.0–100.0)
Platelets: 183 10*3/uL (ref 150–400)
RBC: 2.43 MIL/uL — ABNORMAL LOW (ref 4.22–5.81)
RDW: 21.8 % — ABNORMAL HIGH (ref 11.5–15.5)
WBC: 15.6 10*3/uL — ABNORMAL HIGH (ref 4.0–10.5)
nRBC: 1.3 % — ABNORMAL HIGH (ref 0.0–0.2)

## 2020-03-24 LAB — BASIC METABOLIC PANEL
Anion gap: 8 (ref 5–15)
BUN: 9 mg/dL (ref 6–20)
CO2: 20 mmol/L — ABNORMAL LOW (ref 22–32)
Calcium: 7.5 mg/dL — ABNORMAL LOW (ref 8.9–10.3)
Chloride: 112 mmol/L — ABNORMAL HIGH (ref 98–111)
Creatinine, Ser: 0.59 mg/dL — ABNORMAL LOW (ref 0.61–1.24)
GFR calc non Af Amer: >60 mL/min (ref >60)
Glucose, Bld: 50 mg/dL — ABNORMAL LOW (ref 70–99)
Potassium: 3.4 mmol/L — ABNORMAL LOW (ref 3.5–5.1)
Sodium: 140 mmol/L (ref 135–145)

## 2020-03-24 LAB — MAGNESIUM: Magnesium: 1.7 mg/dL (ref 1.7–2.4)

## 2020-03-24 LAB — PHOSPHORUS: Phosphorus: 2.4 mg/dL — ABNORMAL LOW (ref 2.5–4.6)

## 2020-03-24 MED ORDER — HALOPERIDOL LACTATE 5 MG/ML IJ SOLN: 1.0000 mg | Freq: Four times a day (QID) | INTRAMUSCULAR | Status: DC | PRN

## 2020-03-24 MED ORDER — FUROSEMIDE 10 MG/ML IJ SOLN
20.0000 mg | Freq: Once | INTRAMUSCULAR | Status: AC
  Administered 2020-03-24: 20 mg via INTRAVENOUS
  Filled 2020-03-24: qty 2

## 2020-03-24 MED ORDER — FUROSEMIDE 10 MG/ML IJ SOLN
40.0000 mg | Freq: Once | INTRAMUSCULAR | Status: AC
  Administered 2020-03-24: 40 mg via INTRAVENOUS

## 2020-03-24 MED ORDER — FUROSEMIDE 10 MG/ML IJ SOLN
INTRAMUSCULAR | Status: AC
  Filled 2020-03-24: qty 4

## 2020-03-24 MED ORDER — METRONIDAZOLE IN NACL 5-0.79 MG/ML-% IV SOLN
500.0000 mg | Freq: Three times a day (TID) | INTRAVENOUS | Status: DC
  Administered 2020-03-24 (×2): 500 mg via INTRAVENOUS
  Filled 2020-03-24 (×2): qty 100

## 2020-03-24 MED ORDER — MORPHINE SULFATE (PF) 2 MG/ML IV SOLN
2.0000 mg | INTRAVENOUS | Status: DC | PRN
  Administered 2020-03-24: 2 mg via INTRAVENOUS
  Filled 2020-03-24: qty 1

## 2020-03-24 MED ORDER — MAGNESIUM SULFATE 2 GM/50ML IV SOLN
2.0000 g | Freq: Once | INTRAVENOUS | Status: AC
  Administered 2020-03-24: 2 g via INTRAVENOUS
  Filled 2020-03-24: qty 50

## 2020-03-24 MED ORDER — POTASSIUM CHLORIDE CRYS ER 20 MEQ PO TBCR: 40.0000 meq | EXTENDED_RELEASE_TABLET | Freq: Once | ORAL | Status: DC

## 2020-03-24 MED ORDER — GLYCOPYRROLATE 0.2 MG/ML IJ SOLN: 0.2000 mg | INTRAMUSCULAR | Status: DC | PRN

## 2020-03-24 MED ORDER — SODIUM PHOSPHATES 45 MMOLE/15ML IV SOLN: 30.0000 mmol | Freq: Once | INTRAVENOUS | Status: DC

## 2020-03-24 MED ORDER — POTASSIUM PHOSPHATES 15 MMOLE/5ML IV SOLN
30.0000 mmol | Freq: Once | INTRAVENOUS | Status: DC
  Administered 2020-03-24: 30 mmol via INTRAVENOUS
  Filled 2020-03-24: qty 10

## 2020-03-24 MED ORDER — LORAZEPAM 2 MG/ML IJ SOLN: 1.0000 mg | INTRAMUSCULAR | Status: DC | PRN

## 2020-03-24 MED ORDER — CHOLESTYRAMINE LIGHT 4 G PO PACK
4.0000 g | PACK | Freq: Two times a day (BID) | ORAL | Status: DC
  Filled 2020-03-24: qty 1

## 2020-03-24 NOTE — Progress Notes (Signed)
PT Cancellation Note  Patient Details Name: Roberto Rhodes MRN: [id number] DOB: [birth year]   Cancelled Treatment:    Reason Eval/Treat Not Completed: Medical issues which prohibited therapy Pt with a possible aspiration event last night and has been having desaturations and is currently on BiPAP.     Muneer Leider,KATHrine E 03/24/2020, 11:27 AM Paulino Door, DPT Acute Rehabilitation Services Pager: [phone] Office: [phone]

## 2020-03-24 NOTE — Progress Notes (Signed)
PROGRESS NOTE  Roberto Rhodes  DOB: [birth year]  PCP: Aviva Kluver LDJ:[id number]  DOA: March 18, 2020  LOS: 8 days   Chief Complaint  Patient presents with  . Weakness  . hyperglycemic   Brief narrative: Patient is 59 year old male with PMH significant for diabetes mellitus type 2,, HTN polysubstance abuse, noncompliance with medications.  9/30, patient was brought to the ED from home with extreme weakness. He had generalized weakness for 3 to 4 days and intermittent diarrhea for 3 weeks.  In the ED, his blood pressure dropped less than 90s. Labs showed significant hyperglycemia to more than 1000, mildly elevated b-hydroxybutyrate, elevated anion gap but normal bicarbonate and normal pH.  Calculated serum osmolality was high at 350. Corrected sodium level was 153, creatinine 1.75.  Lactic acid level was elevated to 6.4 Chest x-ray showed mild right infrahilar atelectasis and/or infiltrate.   CT chest showed bibasilar consolidation consistent with multifocal pneumonia. C abdomen pelvis showed wall thickening in the rectosigmoid in a circumferential fashion with surrounding inflammatory change consistent with focal colitis. He was admitted to ICU in a state of septic shock.  He was started on broad-spectrum antibiotics, aggressive IV hydration, IV insulin drip and electrolyte replacements.  Septic shock parameters as well as lactic acid level gradually improved.  Patient is off pressors since 10/4. Glucose level have stabilized on insulin drip and is currently on subcutaneous insulin.  However, patient continues to have persistent liquid bowel movement. 10/3, repeat CT scan of abdomen and pelvis showed progressive infectious/inflammatory colitis, now particularly involving the hepatic flexure.   Subjective: Patient was seen and examined this morning. Patient was on BiPAP. Events from last night noted. Patient became acutely short of breath. Aspiration pneumonia was suspected. Chest x-ray  suggested the same. Patient required oxygen by nasal cannula. He was given IV Lasix. This morning, patient was not able to maintain oxygen saturation on nasal cannula. He was initiated on BiPAP. At the time of my evaluation, patient was alert, awake and able to answer simple questions on BiPAP. He clearly stated to me at the presence of other nursing staff that he does not want chest compression or mechanical ventilation. DNR/DNI status made. Dr. Neale Burly from palliative care saw the patient as well and discussed with his sister-in-law Ms. Dois Davenport. Tentative plan is to continue patient on current treatment measures without escalating or de-escalating for now while patient's brother prepares to visit him in the hospital. Likely to be switched to comfort care measures after family visit.  Assessment/Plan: ##Acute respiratory failure with hypoxia -Last night, patient became acutely short of breath. Aspiration pneumonia was suspected. Chest x-ray suggested the same. He was given IV Lasix. -Patient required oxygen by nasal cannula. -This morning, patient was not able to maintain oxygen saturation on nasal cannula. He was initiated on BiPAP. After discussion with patient and family, CODE STATUS changed to DNR/DNI.  Septic shock - POA -secondary to multifocal pneumonia and colitis.  Management of individual issues as below.  -Currently on IV Rocephin and IV Flagyl.  Acute colitis -Improving with IV antibiotics and Questran   Multifocal pneumonia Aspiration pneumonia --Antibiotics as stated above.  Hyperosmolar hyperglycemic state - POA Mild ketosis DKA - ruled out -Labs on admission showed significant hyperglycemia to more than 1000, mildly elevated b-hydroxybutyrate, elevated anion gap but normal bicarbonate and normal pH.  Calculated serum osmolality was high at 350. -Patient was admitted on insulin drip.  Adequately hydrated.  Eventually switched to subcutaneous insulin.  Type 2 diabetes  mellitus -A1c  more than 15.5 on 9/30 -Currently on subcutaneous insulin.   Moderate protein calorie malnutrition Hypoalbuminemia -Albumin level less than 2.  Low albumin level is contributing to third spacing of fluid evidenced as pedal edema as well as pleural effusion.  -Nutrition consult appreciated.  I gave him a dose of IV albumin on 10/6. -Nutrition Status: Nutrition Problem: Moderate Malnutrition Etiology: acute illness, diarrhea, other (see comment) (persistent hyperglycemia) Signs/Symptoms: mild fat depletion, mild muscle depletion Interventions: MVI, Other (Comment) (Prosource Plus)  Hypokalemia//hypomagnesemia hypophosphatemia -Electrolyte levels low because of persistent diarrhea, poor oral intake. -Replaced aggressively  Elevated liver enzymes -Likely related to chronic alcoholism and septic shock.  No evidence of liver cirrhosis in imaging.  Generalized weakness/lethargy -Multifactorial: Septic shock, profuse diarrhea, low electrolytes, poor oral intake.  Polysubstance abuse Chronic alcoholism  Code Status:   Code Status: DNR  Nutritional status: Body mass index is 31.06 kg/m. Nutrition Problem: Moderate Malnutrition Etiology: acute illness, diarrhea, other (see comment) (persistent hyperglycemia) Signs/Symptoms: mild fat depletion, mild muscle depletion Diet Order            Diet Carb Modified Fluid consistency: Thin; Room service appropriate? Yes  Diet effective now                 DVT prophylaxis: enoxaparin (LOVENOX) injection 40 mg Start: 03/16/20 1000 SCDs Start: 03/16/20 0050   Antimicrobials:  IV Rocephin/IV Flagyl Fluid: Not on IV fluid Consultants: GI, palliative care  Status is: Inpatient  Remains inpatient appropriate because patient has persistent diarrhea, lethargy.  Dispo: The patient is from: Home              Anticipated d/c is to: Not improving. In hospital death more likely               Patient currently is not medically  stable to d/c.  Infusions:  . cefTRIAXone (ROCEPHIN)  IV Stopped (03/23/20 2330)  . metronidazole Stopped (03/24/20 0708)  . potassium PHOSPHATE IVPB (in mmol)      Scheduled Meds: . (feeding supplement) PROSource Plus  30 mL Oral TID BM  . chlorhexidine  15 mL Mouth Rinse BID  . Chlorhexidine Gluconate Cloth  6 each Topical Daily  . cholestyramine light  4 g Oral BID  . enoxaparin (LOVENOX) injection  40 mg Subcutaneous Q24H  . Gerhardt's butt cream   Topical BID  . insulin aspart  0-5 Units Subcutaneous QHS  . insulin aspart  0-9 Units Subcutaneous TID WC  . multivitamin with minerals  1 tablet Oral Daily  . pantoprazole  40 mg Oral Daily  . phosphorus  500 mg Oral BID  . potassium chloride  40 mEq Oral Once  . sodium chloride flush  10-40 mL Intracatheter Q12H  . thiamine  100 mg Oral Daily    Antimicrobials: Anti-infectives (From admission, onward)   Start     Dose/Rate Route Frequency Ordered Stop   03/24/20 0600  metroNIDAZOLE (FLAGYL) IVPB 500 mg        500 mg 100 mL/hr over 60 Minutes Intravenous Every 8 hours 03/24/20 0530     03/20/20 2200  cefTRIAXone (ROCEPHIN) 2 g in sodium chloride 0.9 % 100 mL IVPB        2 g 200 mL/hr over 30 Minutes Intravenous Every 24 hours 03/20/20 1440     03/17/20 1415  ceFEPIme (MAXIPIME) 2 g in sodium chloride 0.9 % 100 mL IVPB  Status:  Discontinued        2 g 200 mL/hr over  30 Minutes Intravenous Every 8 hours 03/17/20 1410 03/20/20 1437   03/17/20 1415  linezolid (ZYVOX) tablet 600 mg  Status:  Discontinued        600 mg Oral Every 12 hours 03/17/20 1410 03/20/20 1437   03/17/20 1415  metroNIDAZOLE (FLAGYL) tablet 500 mg  Status:  Discontinued        500 mg Oral Every 8 hours 03/17/20 1410 03/24/20 0530   03/16/20 1830  azithromycin (ZITHROMAX) 500 mg in sodium chloride 0.9 % 250 mL IVPB  Status:  Discontinued        500 mg 250 mL/hr over 60 Minutes Intravenous Every 24 hours 03/16/20 1804 03/17/20 1322   03/16/20 1830   cefTRIAXone (ROCEPHIN) 1 g in sodium chloride 0.9 % 100 mL IVPB  Status:  Discontinued        1 g 200 mL/hr over 30 Minutes Intravenous Every 24 hours 03/16/20 1804 03/17/20 1322   03/09/2020 2245  vancomycin (VANCOREADY) IVPB 2000 mg/400 mL        2,000 mg 200 mL/hr over 120 Minutes Intravenous  Once 02/24/2020 2242 03/16/20 0116   02/28/2020 2215  ceFEPIme (MAXIPIME) 2 g in sodium chloride 0.9 % 100 mL IVPB        2 g 200 mL/hr over 30 Minutes Intravenous  Once 02/20/2020 2202 02/18/2020 2302      PRN meds: dextrose, glycopyrrolate, haloperidol lactate, levalbuterol, LORazepam, morphine injection, oxyCODONE, sodium chloride flush   Objective: Vitals:   03/24/20 0900 03/24/20 0912  BP: (!) 149/87   Pulse: (!) 107 (!) 113  Resp: (!) 21 (!) 23  Temp:    SpO2: 90% 94%    Intake/Output Summary (Last 24 hours) at 03/24/2020 1135 Last data filed at 03/24/2020 1000 Gross per 24 hour  Intake 276.22 ml  Output 1100 ml  Net -823.78 ml   Filed Weights   03/16/20 1157 03/21/20 1400  Weight: 72.6 kg 98.2 kg   Weight change:  Body mass index is 31.06 kg/m.   Physical Exam: General exam: Lethargic look. Tachypneic on BiPAP.  Skin: No rashes, lesions or ulcers. HEENT: Atraumatic, normocephalic, supple neck, no obvious bleeding Lungs: Gurgly throat sound.  CVS: Regular rate and rhythm, no murmur GI/Abd soft, distended, nontender abdomen, bowel sound present CNS: Alert, awake, slow to respond, able to answer simple yes/no questions Psychiatry: Depressed look Extremities: 1+ pedal edema bilaterally.  No calf tenderness.  Data Review: I have personally reviewed the laboratory data and studies available.  Recent Labs  Lab 03/20/20 1920 03/21/20 0500 03/22/20 0546 03/23/20 0556 03/24/20 0639  WBC 13.9* 16.2* 12.1* 13.6* 15.6*  NEUTROABS  --  13.5* 9.3* 11.5*  --   HGB 9.3* 9.6* 7.6* 8.2* 8.5*  HCT 27.7* 28.5* 23.1* 24.9* 25.8*  MCV 102.6* 103.6* 105.5* 106.9* 106.2*  PLT 109* 123*  128* 177 183   Signed, Lorin Glass, MD [facility] 03/24/2020

## 2020-03-24 NOTE — Progress Notes (Signed)
eLink Physician-Brief Progress Note Patient Name: Roberto Rhodes DOB: 03/18/1961 MRN: [id number]   Date of Service  03/24/2020  HPI/Events of Note  Episodic desaturation, patient is fluid balance positive and CXR shows parenchymal infiltrates ans pleural effusions.  eICU Interventions  Lasix 20 mg iv x 1, BIPAP for desaturation.        [name] 03/24/2020, 2:21 AM

## 2020-03-24 NOTE — Progress Notes (Signed)
Called by bedside RN with concerns for hypoxia. According to RN, pt possibly aspirated on dayshift and appears to be having trouble maintaining his sats. On assessment, pt is sitting up in bed watching television. He currently denies any SOB, chest pain/discomfort, N/V. He states that he felt unwell earlier but feels fine now. He requested his TV volume be turned up and the lights turned off. VS are stable. Pt sats are 90-95% at time of encounter. Assessment unremarkable.  ?Aaspiration - Continue with supplemental oxygent - Chest xray - ABG - Continue to monitor  Audrea Muscat, NP [facility] 7p-7a [phone]

## 2020-03-24 NOTE — Progress Notes (Signed)
[facility] Gastroenterology Progress Note  Roberto Rhodes 59 y.o. [birth year]  CC:  Colitis  Subjective: Patient states abdominal pain is about the same as previous days.  Diarrhea has decreased, and it appears rectal tube has been removed.  Patient had a possible aspiration event last night and has been having desaturations and is currently on BiPAP.  ROS : Review of Systems  Respiratory: Positive for shortness of breath. Negative for cough.   Gastrointestinal: Positive for abdominal pain and diarrhea. Negative for blood in stool, constipation, heartburn, melena, nausea and vomiting.   Objective: Vital signs in last 24 hours: Vitals:   03/24/20 0900 03/24/20 0912  BP: (!) 149/87   Pulse: (!) 107 (!) 113  Resp: (!) 21 (!) 23  Temp:    SpO2: 90% 94%    Physical Exam:  General:  Sitting in bed, BiPAP in place  Head:  Normocephalic, without obvious abnormality, atraumatic  Eyes:  Anicteric sclera, EOMs intact   Lungs:   No respiratory distress,  BiPAP in place  Heart:  Tachycardic  Abdomen:   Soft, nontender, and nondistended.  No guarding or peritoneal signs.  Extremities: +Bilateral pedal edema  Pulses: 2+ and symmetric    Lab Results: Recent Labs    03/23/20 0556 03/24/20 0639  NA 139 140  K 3.6 3.4*  CL 113* 112*  CO2 16* 20*  GLUCOSE 182* 50*  BUN 9 9  CREATININE 0.57* 0.59*  CALCIUM 7.4* 7.5*  MG 1.8 1.7  PHOS 2.5 2.4*   Recent Labs    03/23/20 0556  AST 87*  ALT 37  ALKPHOS 142*  BILITOT 2.3*  PROT 5.9*  ALBUMIN 1.8*   Recent Labs    03/22/20 0546 03/22/20 0546 03/23/20 0556 03/24/20 0639  WBC 12.1*   < > 13.6* 15.6*  NEUTROABS 9.3*  --  11.5*  --   HGB 7.6*   < > 8.2* 8.5*  HCT 23.1*   < > 24.9* 25.8*  MCV 105.5*   < > 106.9* 106.2*  PLT 128*   < > 177 183   < > = values in this interval not displayed.   No results for input(s): LABPROT, INR in the last 72 hours.  Assessment: Colitis, much improved on empiric antibiotics and cholestyramine.   Only 50cc stool output in last 24h. -C diff negative, GI pathogen panel negative, though may be false negative due to antibiotic therapy.  GERD: on daily PPI at home  Pneumonia, Septic shock  Transaminitis, most likely from septic shock  Macrocytic anemia: Hgb 8.5 as compared to 8.2 yesterday, no signs of GI bleeding  DM type 2  Plan: Continue empiric antibiotics for colitis for a total of 14 days.    Continue cholestyramine, can titrate dose based on bowel movements.  [facility] will sign off. Thank you for the consultation. Please contact us if we can be of any further assistance during this hospital stay.  Edrick Kins PA-C 03/24/2020, 10:55 AM  Contact #  [phone]

## 2020-03-24 NOTE — Consult Note (Signed)
Palliative care consult  Palliative care consult received.  Chart reviewed including personal review of pertinent labs and imaging.  Briefly, Roberto Rhodes is a 59 year old male with past medical history of uncontrolled diabetes mellitus, hypertension, prior substance abuse who was admitted with septic shock likely secondary to multifocal pneumonia and colitis as well as hyperosmolar hyperglycemic state and uncontrolled diabetes with A1c greater than 15.5.  His clinical course has worsened over the past 24 hours after he had an aspiration event.  He is currently on BiPAP but is awake and alert and able to fully participate in conversation.  He has been telling staff that he does not want aggressive interventions and wants only to be comfortable.  Palliative consulted for goals of care.  I saw and examined Roberto Rhodes today.  He was lying in bed with mild increased work of breathing while wearing BiPAP.    He tells me he is tired and "ready to go".  I asked him to tell me more about what he meant by this and he told me that he is dying.  He expressed that he is okay with this and does not want to suffer.  We discussed difference between a aggressive medical intervention path and a palliative, comfort focused care path.  Values and goals of care important to patient and family were attempted to be elicited.  We discussed clinical course as well as wishes moving forward in regard to care plan.  Concepts specific to code status and options for care discussed.  He was very clear in stating that he is tired, ready to die, and does not want to wear BiPAP any longer.  We discussed that time will be short and he indicated that he is at peace with this decision.  His primary concern was making sure that I told his brother that he feels they are in a good place and it is okay that he dies.   I called and was able to reach patient's sister-in-law, Roberto Rhodes.  She reports that Roberto Rhodes has had a difficult time since his  mother died several years ago.  He viewed his charge in life to be to care for her, and he did this to the best of his ability.  Since she died, he has had difficulty maintaining his own health and has fallen into relapses with addiction.    Overall, he is a very kind and loving individual but family understands that he is tired.  They do not want to try to force him to do things that he does not want to do, however, Roberto Rhodes is sure that her husband, Roberto Rhodes (patient's brother) would want to come and see him before he dies if possible.  He is currently at work and cannot carry a cell phone there.  She will reach out to her husband's work to request him to call me.  Shortly after speaking with Roberto Rhodes, I received a call from his brother, Roberto Rhodes, who is coming to visit.  He is clear that he does not want Roberto Rhodes to suffer but he would like to see him before he dies if possible.  We discussed plan to continue medications as needed for comfort as top priority, but I also will not de-escalate care until he has a chance to come and see his brother.  If Roberto Rhodes is willing to wear BiPAP, we will continue to gently encourage this until his brother is able to arrive.  If, however, he continues to pull it off (  as he has been) I discussed with Roberto Rhodes that we would not force him to continue to wear it.  He is in agreement with this plan.  He again stated that his #1 goal is that Roberto Rhodes does not suffer.  I placed orders for medications for comfort and these should not be held if needed to ensure his comfort regardless of oxygen saturation, respiratory rate, or blood pressure.  Discussed with bedside RN who understands and is in agreement with plan.  Recommendations: - DNR/DNI: No escalation of care and plan is for transition to full comfort care once family can come and see him -Primary goal moving forward is for Roberto Rhodes comfort.  At the same time, his brother would like to be able to come and see him before he dies.   I placed orders for medications for comfort and these should not be held if needed to maintain his comfort regardless of side effect such as hypotension, respiratory rate, or hypoxia.  I did not, however, de-escalate other care at this time with hope of allowing family the opportunity to come and be with him. -Unrestricted visitation.  Start time:1000 End time: 1120 Total time: 80 minutes -

## 2020-03-24 NOTE — Progress Notes (Addendum)
Hypoglycemic Event  CBG: 44 Treatment: D50 1 Amp  Symptoms: None Follow-up CBG: Time:830  CBG Result:102  Possible Reasons for Event:Poor PO    Roberto Rhodes

## 2020-03-24 NOTE — Progress Notes (Addendum)
I saw and examined [name].    He is less responsive than this morning.  He barely arouses.  Denies pain but he is a little tachypnic at this point.  He is actively dying.  His brother has been in to see him but had to leave.  He is planning on coming back tonight.    I had discussed earlier today with [name] that, per his request, we would only continue life prolonging interventions long enough to allow his brother to visit.  As his brother has been able to see him. will d/c interventions not related specifically to comfort (abx, bipap) at this time to honor his request/wishes.  Romie Minus, MD [facility] Palliative Medicine Team [phone]

## 2020-03-25 LAB — GLUCOSE, CAPILLARY: Glucose-Capillary: 158 mg/dL — ABNORMAL HIGH (ref 70–99)

## 2020-03-27 LAB — URINE DRUGS OF ABUSE SCREEN W ALC, ROUTINE (REF LAB)
Amphetamines, Urine: NEGATIVE ng/mL
Barbiturate, Ur: NEGATIVE ng/mL
Benzodiazepine Quant, Ur: NEGATIVE ng/mL
Cannabinoid Quant, Ur: NEGATIVE ng/mL
Cocaine (Metab.): NEGATIVE ng/mL
Methadone Screen, Urine: NEGATIVE ng/mL
Opiate Quant, Ur: NEGATIVE ng/mL
Phencyclidine, Ur: NEGATIVE ng/mL
Propoxyphene, Urine: NEGATIVE ng/mL

## 2020-03-27 LAB — ETHANOL CONFIRM, URINE: Ethanol, Ur - Confirmation: 0.055 %

## 2020-04-17 NOTE — Progress Notes (Signed)
Patient deceased at 23 with heart rate at 0. Heart sounds auscultated by Dennard Nip, RN and Heber Jardine, RN for verification. Brother, Molly Maduro was called about patient status. Family now at bedside (brother and brother's wife).

## 2020-04-17 NOTE — Death Summary Note (Signed)
DEATH SUMMARY   Patient Details  Name: Roberto Rhodes MRN: [id number] DOB: 06-30-1960  Admission/Discharge Information   Admit Date:  03-22-2020  Date of Death: Date of Death: 2020/04/01  Time of Death: Time of Death: 0508  Length of Stay: 10/31/22  Referring Physician: Pcp, No   Reason(s) for Hospitalization  Septic shock  Diagnoses  Preliminary cause of death:  Secondary Diagnoses (including complications and co-morbidities):  Principal Problem:   Septic shock (HCC) Active Problems:   DKA (diabetic ketoacidoses)   Acute hypernatremia   Multifocal pneumonia   Colitis   Polysubstance abuse (HCC)   Uncontrolled diabetes mellitus (HCC)   AKI (acute kidney injury) (HCC)   Malnutrition of moderate degree   Brief Hospital Course (including significant findings, care, treatment, and services provided and events leading to death)  Patient is 59 year old male with PMH significant for diabetes mellitus type 2,, HTN polysubstance abuse, noncompliance with medications.  9/30, patient was brought to the ED from home with extreme weakness. He had generalized weakness for 3 to 4 days and intermittent diarrhea for 3 weeks.  In the ED, his blood pressure dropped less than 90s. Labs showed significant hyperglycemia to more than 1000, mildly elevated b-hydroxybutyrate, elevated anion gap but normal bicarbonate and normal pH.  Calculated serum osmolality was high at 350. Corrected sodium level was 153, creatinine 1.75.  Lactic acid level was elevated to 6.4 Chest x-ray showed mild right infrahilar atelectasis and/or infiltrate.  CT chest showed bibasilar consolidation consistent with multifocal pneumonia. C abdomen pelvis showed wall thickening in the rectosigmoid in a circumferential fashion with surrounding inflammatory change consistent with focal colitis. He was admitted to ICU in a state of septic shock.  He was started on broad-spectrum antibiotics, aggressive IV hydration, IV insulin drip and  electrolyte replacements.  Septic shock parameters as well as lactic acid level gradually improved.  Patient was taken off pressors since 10/4. Glucose level was stabilized on insulin drip and was subsequently switched to subcutaneous insulin.  However, patient continues to have persistent liquid bowel movement. 10/3, repeat CT scan of abdomen and pelvis showed progressive infectious/inflammatory colitis, now particularly involving the hepatic flexure.   Cholestyramine was started after which diarrhea volume improved but patient's overall condition did not improve.  His albumin level was lower than two persistently.  He has been low intravascular volume but was third spacing.  He developed pulmonary edema, pleural effusion compromising his respiratory function. Multiple conversations with family members were done. 10/8, patient required to be on BiPAP.  He clearly stated that he does not want resuscitated or intubated. Palliative care consultation was obtained.  Patient was made DNR/DNI. Patient and family chose comfort care measures only. Patient expired at 5:08 AM today 04/01/2020  Cause of death: Septic shock secondary to acute colitis Other associated issues: Multifocal pneumonia, hypoalbuminemia, hyperosmolar nonketotic state.   Pertinent Labs and Studies  Significant Diagnostic Studies DG Chest 1 View  Result Date: 03/20/2020 CLINICAL DATA:  Patient admitted 03-22-2020 and DKA with lactic acidosis presumably due to pneumonia. EXAM: CHEST  1 VIEW COMPARISON:  Single-view of the chest 2020/03/22. FINDINGS: The patient has a new small to moderate left pleural effusion and basilar airspace disease. The right lung is clear. No pneumothorax. Heart size is normal. Tip of right PICC projects in the lower superior vena cava. IMPRESSION: New small to moderate left pleural effusion and basilar airspace disease which could be due to atelectasis or pneumonia. Right PICC projects in good position.  Electronically Signed  By: Drusilla Kanner M.D.   On: 03/20/2020 13:02   CT CHEST W CONTRAST  Result Date: 03/16/2020 CLINICAL DATA:  Heme-positive stools EXAM: CT CHEST, ABDOMEN, AND PELVIS WITH CONTRAST TECHNIQUE: Multidetector CT imaging of the chest, abdomen and pelvis was performed following the standard protocol during bolus administration of intravenous contrast. CONTRAST:  OMNIPAQUE IOHEXOL 300 MG/ML  SOLN COMPARISON:  06/26/2017 FINDINGS: CT CHEST FINDINGS Cardiovascular: Thoracic aorta demonstrates a normal branching pattern. No aneurysmal dilatation or dissection is noted. No cardiac enlargement is seen. The pulmonary artery as visualized is within normal limits. Mediastinum/Nodes: Thoracic inlet is unremarkable. No sizable hilar or mediastinal adenopathy is noted. The esophagus is within normal limits. Lungs/Pleura: Bibasilar consolidation is noted consistent with multifocal pneumonia. No sizable effusion is noted. No pneumothorax is seen. Musculoskeletal: Degenerative changes of the thoracic spine are noted. Old rib fractures are noted with healing. CT ABDOMEN PELVIS FINDINGS Hepatobiliary: Diffuse fatty infiltration of the liver is noted with the exception of some fatty sparing centrally. Gallbladder is well distended with small dependent gallstones. No complicating factors are seen. Pancreas: Unremarkable. No pancreatic ductal dilatation or surrounding inflammatory changes. Spleen: Normal in size without focal abnormality. Adrenals/Urinary Tract: Adrenal glands demonstrate a small nodule within the right adrenal gland consistent with small adenoma. This is stable from the prior exam. Kidneys show bilateral nonobstructing renal calculi. Normal enhancement is seen. No ureteral stones are noted. The bladder is within normal limits. Stomach/Bowel: There is again noted some wall thickening within the rectum extending towards the rectosigmoid with mild pericolonic inflammatory change consistent  with focal colitis. The more proximal colon appears within normal limits. The appendix is within normal limits. No small bowel or gastric abnormality is noted. Vascular/Lymphatic: Aortic atherosclerosis. No enlarged abdominal or pelvic lymph nodes. Reproductive: Prostate is unremarkable. Other: No abdominal wall hernia or abnormality. No abdominopelvic ascites. Musculoskeletal: Degenerative changes of lumbar spine are noted. No acute bony abnormality is seen. IMPRESSION: Bibasilar consolidation consistent with multifocal pneumonia. Wall thickening in the rectosigmoid in a circumferential fashion with surrounding inflammatory change consistent with focal colitis. This is similar to that seen on prior exam. Chronic changes as described above. Electronically Signed   By: Alcide Clever M.D.   On: 03/16/2020 10:49   CT ABDOMEN PELVIS W CONTRAST  Result Date: 03/19/2020 CLINICAL DATA:  Sepsis, lower abdominal pain, increasing lactate, evaluate for worsening colitis EXAM: CT ABDOMEN AND PELVIS WITH CONTRAST TECHNIQUE: Multidetector CT imaging of the abdomen and pelvis was performed using the standard protocol following bolus administration of intravenous contrast. CONTRAST:  OMNIPAQUE IOHEXOL 300 MG/ML  SOLN COMPARISON:  03/16/2020 FINDINGS: Lower chest: Small right and small to moderate left pleural effusions, increased. Associated lower lobe opacities, likely atelectasis. Hepatobiliary: Geographic hepatic steatosis with areas of focal fatty sparing. Layering small gallstones (series 2/image 35), without associated inflammatory changes. No intrahepatic or extrahepatic ductal dilatation. Pancreas: Within normal limits. Spleen: Within normal limits. Adrenals/Urinary Tract: Stable 14 mm right adrenal nodule, unchanged from 2019, likely reflecting a benign adrenal adenoma. Left adrenal gland is within normal limits. Numerous bilateral nonobstructing renal calculi, measuring up to 6 mm in the left upper kidney (series  2/image 31). 11 mm right lower pole renal cyst (series 2/image 38). 3.5 cm bilobed cyst in the left lower kidney (series 2/image 49), benign (Bosniak II). No hydronephrosis. Bladder is within normal limits. Stomach/Bowel: Stomach is within normal limits. No evidence of bowel obstruction. Normal appendix (series 2/image 66). Long segment wall thickening involving the  colon, particularly involving the hepatic flexure on the current CT (series 2/image 46), reflecting infectious/inflammatory colitis. As appearance is progressive from the prior, which was largely isolated to the rectosigmoid colon. No pneumatosis. No drainable fluid collection/abscess. No free air. Vascular/Lymphatic: No evidence of abdominal aortic aneurysm. Atherosclerotic calcifications of the abdominal aorta and branch vessels. No suspicious abdominopelvic lymphadenopathy. Reproductive: Prostate is unremarkable. Other: Small to moderate abdominopelvic ascites, new. Musculoskeletal: Visualized osseous structures are within normal limits. IMPRESSION: Progressive infectious/inflammatory colitis, now particularly involving the hepatic flexure. No pneumatosis. No drainable fluid collection/abscess. No free air. Small to moderate abdominopelvic ascites, new. Small right and small to moderate left pleural effusions, increased. Associated lower lobe opacities, likely atelectasis. Additional stable ancillary findings as above. Electronically Signed   By: Charline Bills M.D.   On: 03/19/2020 13:38   CT ABDOMEN PELVIS W CONTRAST  Result Date: 03/16/2020 CLINICAL DATA:  Heme-positive stools EXAM: CT CHEST, ABDOMEN, AND PELVIS WITH CONTRAST TECHNIQUE: Multidetector CT imaging of the chest, abdomen and pelvis was performed following the standard protocol during bolus administration of intravenous contrast. CONTRAST:  OMNIPAQUE IOHEXOL 300 MG/ML  SOLN COMPARISON:  06/26/2017 FINDINGS: CT CHEST FINDINGS Cardiovascular: Thoracic aorta demonstrates a  normal branching pattern. No aneurysmal dilatation or dissection is noted. No cardiac enlargement is seen. The pulmonary artery as visualized is within normal limits. Mediastinum/Nodes: Thoracic inlet is unremarkable. No sizable hilar or mediastinal adenopathy is noted. The esophagus is within normal limits. Lungs/Pleura: Bibasilar consolidation is noted consistent with multifocal pneumonia. No sizable effusion is noted. No pneumothorax is seen. Musculoskeletal: Degenerative changes of the thoracic spine are noted. Old rib fractures are noted with healing. CT ABDOMEN PELVIS FINDINGS Hepatobiliary: Diffuse fatty infiltration of the liver is noted with the exception of some fatty sparing centrally. Gallbladder is well distended with small dependent gallstones. No complicating factors are seen. Pancreas: Unremarkable. No pancreatic ductal dilatation or surrounding inflammatory changes. Spleen: Normal in size without focal abnormality. Adrenals/Urinary Tract: Adrenal glands demonstrate a small nodule within the right adrenal gland consistent with small adenoma. This is stable from the prior exam. Kidneys show bilateral nonobstructing renal calculi. Normal enhancement is seen. No ureteral stones are noted. The bladder is within normal limits. Stomach/Bowel: There is again noted some wall thickening within the rectum extending towards the rectosigmoid with mild pericolonic inflammatory change consistent with focal colitis. The more proximal colon appears within normal limits. The appendix is within normal limits. No small bowel or gastric abnormality is noted. Vascular/Lymphatic: Aortic atherosclerosis. No enlarged abdominal or pelvic lymph nodes. Reproductive: Prostate is unremarkable. Other: No abdominal wall hernia or abnormality. No abdominopelvic ascites. Musculoskeletal: Degenerative changes of lumbar spine are noted. No acute bony abnormality is seen. IMPRESSION: Bibasilar consolidation consistent with multifocal  pneumonia. Wall thickening in the rectosigmoid in a circumferential fashion with surrounding inflammatory change consistent with focal colitis. This is similar to that seen on prior exam. Chronic changes as described above. Electronically Signed   By: Alcide Clever M.D.   On: 03/16/2020 10:49   DG CHEST PORT 1 VIEW  Result Date: 03/23/2020 CLINICAL DATA:  Recent aspiration EXAM: PORTABLE CHEST 1 VIEW COMPARISON:  03/20/2020 FINDINGS: Cardiac shadow is enlarged but stable. Right-sided PICC line is noted in satisfactory position. Increasing bibasilar opacities are noted consistent with given history is aspiration. Small effusions are noted. No other focal abnormality is noted. IMPRESSION: Bibasilar airspace opacities with small effusions consistent with the given clinical history aspiration Electronically Signed   By: Alcide Clever  M.D.   On: 03/23/2020 20:54   DG Chest Port 1 View  Result Date: 03/14/2020 CLINICAL DATA:  Generalized weakness. EXAM: PORTABLE CHEST 1 VIEW COMPARISON:  June 27, 2019 FINDINGS: There is mild right infrahilar atelectasis and/or infiltrate. There is no evidence of a pleural effusion or pneumothorax. The heart size and mediastinal contours are within normal limits. A chronic fracture deformity of the proximal right humeral shaft is seen. IMPRESSION: Mild right infrahilar atelectasis and/or infiltrate. Electronically Signed   By: Aram Candela M.D.   On: 03/11/2020 21:47   ECHOCARDIOGRAM COMPLETE  Result Date: 03/16/2020    ECHOCARDIOGRAM REPORT   Patient Name:   Sung Grassia Date of Exam: 03/16/2020 Medical Rec #:  [id number]  Height:       70.0 in Accession #:    [id number] Weight:       200.0 lb Date of Birth:  November 20, 1960  BSA:          2.087 m Patient Age:    59 years   BP:           90/56 mmHg Patient Gender: M          HR:           78 bpm. Exam Location:  Inpatient Procedure: 2D Echo Indications:    Congestive Heart Failure I50.9  History:        Patient has no prior  history of Echocardiogram examinations.                 Risk Factors:Diabetes.  Sonographer:    Thurman Coyer RDCS (AE) Referring Phys: [id number] CHRISTOPHER R DOROTHY IMPRESSIONS  1. Left ventricular ejection fraction, by estimation, is 65 to 70%. The left ventricle has normal function. The left ventricle has no regional wall motion abnormalities. Left ventricular diastolic parameters are indeterminate.  2. Right ventricular systolic function is normal. The right ventricular size is normal.  3. The mitral valve is normal in structure. Trivial mitral valve regurgitation.  4. The aortic valve is tricuspid. Aortic valve regurgitation is not visualized. Mild aortic valve sclerosis is present, with no evidence of aortic valve stenosis.  5. Aortic dilatation noted. There is mild dilatation of the aortic root, measuring 39 mm.  6. The inferior vena cava is normal in size with greater than 50% respiratory variability, suggesting right atrial pressure of 3 mmHg. Comparison(s): No prior Echocardiogram. FINDINGS  Left Ventricle: Left ventricular ejection fraction, by estimation, is 65 to 70%. The left ventricle has normal function. The left ventricle has no regional wall motion abnormalities. The left ventricular internal cavity size was normal in size. There is  no left ventricular hypertrophy. Left ventricular diastolic parameters are indeterminate. Right Ventricle: The right ventricular size is normal. No increase in right ventricular wall thickness. Right ventricular systolic function is normal. Left Atrium: Left atrial size was normal in size. Right Atrium: Right atrial size was normal in size. Pericardium: There is no evidence of pericardial effusion. Mitral Valve: The mitral valve is normal in structure. Trivial mitral valve regurgitation. Tricuspid Valve: The tricuspid valve is normal in structure. Tricuspid valve regurgitation is trivial. Aortic Valve: The aortic valve is tricuspid. Aortic valve regurgitation is  not visualized. Mild aortic valve sclerosis is present, with no evidence of aortic valve stenosis. Pulmonic Valve: The pulmonic valve was normal in structure. Pulmonic valve regurgitation is not visualized. Aorta: Aortic dilatation noted. There is mild dilatation of the aortic root, measuring 39 mm. Venous: The inferior vena cava is normal  in size with greater than 50% respiratory variability, suggesting right atrial pressure of 3 mmHg. IAS/Shunts: No atrial level shunt detected by color flow Doppler.  LEFT VENTRICLE PLAX 2D LVIDd:         4.40 cm  Diastology LVIDs:         3.20 cm  LV e' medial:    6.42 cm/s LV PW:         1.10 cm  LV E/e' medial:  10.1 LV IVS:        0.90 cm  LV e' lateral:   8.38 cm/s LVOT diam:     2.30 cm  LV E/e' lateral: 7.7 LV SV:         116 LV SV Index:   56 LVOT Area:     4.15 cm  RIGHT VENTRICLE RV S prime:     15.90 cm/s TAPSE (M-mode): 1.8 cm LEFT ATRIUM             Index       RIGHT ATRIUM           Index LA diam:        3.60 cm 1.72 cm/m  RA Area:     13.40 cm LA Vol (A2C):   49.0 ml 23.48 ml/m RA Volume:   27.30 ml  13.08 ml/m LA Vol (A4C):   65.9 ml 31.57 ml/m LA Biplane Vol: 59.8 ml 28.65 ml/m  AORTIC VALVE LVOT Vmax:   118.00 cm/s LVOT Vmean:  86.300 cm/s LVOT VTI:    0.279 m  AORTA Ao Root diam: 3.90 cm MITRAL VALVE MV Area (PHT): 2.39 cm    SHUNTS MV Decel Time: 317 msec    Systemic VTI:  0.28 m MV E velocity: 64.70 cm/s  Systemic Diam: 2.30 cm MV A velocity: 55.30 cm/s MV E/A ratio:  1.17 Laurance FlattenHeather Pemberton MD Electronically signed by Laurance FlattenHeather Pemberton MD Signature Date/Time: 03/16/2020/11:09:57 AM    Final    US EKG SITE RITE  Result Date: 03/16/2020 If Site Rite image not attached, placement could not be confirmed due to current cardiac rhythm.   Microbiology Recent Results (from the past 240 hour(s))  Culture, blood (Routine x 2)     Status: None   Collection Time: 10-26-19  8:00 PM   Specimen: BLOOD  Result Value Ref Range Status   Specimen Description    Final    BLOOD RIGHT ANTECUBITAL Performed at [facility], [address] [place], [place] [address]    Special Requests   Final    BOTTLES DRAWN AEROBIC AND ANAEROBIC Blood Culture adequate volume Performed at [facility], [address] [place], [place] [address]    Culture   Final    NO GROWTH 5 DAYS Performed at [facility], [address] [place], [place] [address]    Report Status 03/20/2020 FINAL  Final  Culture, blood (Routine x 2)     Status: None   Collection Time: 10-26-19  8:00 PM   Specimen: BLOOD  Result Value Ref Range Status   Specimen Description   Final    BLOOD RIGHT ANTECUBITAL Performed at [facility], [address] [place], [place] [address]    Special Requests   Final    BOTTLES DRAWN AEROBIC AND ANAEROBIC Blood Culture results may not be optimal due to an inadequate volume of blood received in culture bottles Performed at [facility], [address] [place], [place] [address]  Culture   Final    NO GROWTH 5 DAYS Performed at [facility], [address], [place], [place] [address]    Report Status 03/20/2020 FINAL  Final  Respiratory Panel by RT PCR (Flu A&B, Covid) - Nasopharyngeal Swab     Status: None   Collection Time: 02/28/2020  8:32 PM   Specimen: Nasopharyngeal Swab  Result Value Ref Range Status   SARS Coronavirus 2 by RT PCR NEGATIVE NEGATIVE Final    Comment: (NOTE) SARS-CoV-2 target nucleic acids are NOT DETECTED.  The SARS-CoV-2 RNA is generally detectable in upper respiratoy specimens during the acute phase of infection. The lowest concentration of SARS-CoV-2 viral copies this assay can detect is 131 copies/mL. A negative result does not preclude SARS-Cov-2 infection and should not be used as the sole basis for treatment or other patient management decisions. A negative result may occur with  improper specimen collection/handling,  submission of specimen other than nasopharyngeal swab, presence of viral mutation(s) within the areas targeted by this assay, and inadequate number of viral copies (<131 copies/mL). A negative result must be combined with clinical observations, patient history, and epidemiological information. The expected result is Negative.  Fact Sheet for Patients:  https://www.moore.com/  Fact Sheet for Healthcare Providers:  https://www.young.biz/  This test is no t yet approved or cleared by the [place] FDA and  has been authorized for detection and/or diagnosis of SARS-CoV-2 by FDA under an Emergency Use Authorization (EUA). This EUA will remain  in effect (meaning this test can be used) for the duration of the COVID-19 declaration under Section 564(b)(1) of the Act, 21 U.S.C. section 360bbb-3(b)(1), unless the authorization is terminated or revoked sooner.     Influenza A by PCR NEGATIVE NEGATIVE Final   Influenza B by PCR NEGATIVE NEGATIVE Final    Comment: (NOTE) The Xpert Xpress SARS-CoV-2/FLU/RSV assay is intended as an aid in  the diagnosis of influenza from Nasopharyngeal swab specimens and  should not be used as a sole basis for treatment. Nasal washings and  aspirates are unacceptable for Xpert Xpress SARS-CoV-2/FLU/RSV  testing.  Fact Sheet for Patients: https://www.moore.com/  Fact Sheet for Healthcare Providers: https://www.young.biz/  This test is not yet approved or cleared by the [place] FDA and  has been authorized for detection and/or diagnosis of SARS-CoV-2 by  FDA under an Emergency Use Authorization (EUA). This EUA will remain  in effect (meaning this test can be used) for the duration of the  Covid-19 declaration under Section 564(b)(1) of the Act, 21  U.S.C. section 360bbb-3(b)(1), unless the authorization is  terminated or revoked. Performed at [facility] [address], [place], [place] [address]   MRSA PCR Screening     Status: Abnormal   Collection Time: 03/16/20  9:29 AM   Specimen: Nasal Mucosa; Nasopharyngeal  Result Value Ref Range Status   MRSA by PCR POSITIVE (A) NEGATIVE Final    Comment:        The GeneXpert MRSA Assay (FDA approved for NASAL specimens only), is one component of a comprehensive MRSA colonization surveillance program. It is not intended to diagnose MRSA infection nor to guide or monitor treatment for MRSA infections. RESULT CALLED TO, READ BACK BY AND VERIFIED WITH: WEST, S. RN @1117  ON 9.30.2021 BY [facility] Performed at [facility], [address], [place], [place] [address]   C Difficile Quick Screen (NO PCR Reflex)     Status: None   Collection Time: 03/19/20  11:19 AM   Specimen: STOOL  Result Value Ref Range Status   C Diff antigen NEGATIVE NEGATIVE Final   C Diff toxin NEGATIVE NEGATIVE Final   C Diff interpretation No C. difficile detected.  Final    Comment: VALID Performed at [facility], [address], [place], [place] [address]   Gastrointestinal Panel by PCR , Stool     Status: None   Collection Time: 03/20/20 12:41 PM   Specimen: Stool  Result Value Ref Range Status   Campylobacter species NOT DETECTED NOT DETECTED Final   Plesimonas shigelloides NOT DETECTED NOT DETECTED Final   Salmonella species NOT DETECTED NOT DETECTED Final   Yersinia enterocolitica NOT DETECTED NOT DETECTED Final   Vibrio species NOT DETECTED NOT DETECTED Final   Vibrio cholerae NOT DETECTED NOT DETECTED Final   Enteroaggregative E coli (EAEC) NOT DETECTED NOT DETECTED Final   Enteropathogenic E coli (EPEC) NOT DETECTED NOT DETECTED Final   Enterotoxigenic E coli (ETEC) NOT DETECTED NOT DETECTED Final   Shiga like toxin producing E coli (STEC) NOT DETECTED NOT DETECTED Final   Shigella/Enteroinvasive E coli (EIEC) NOT DETECTED NOT DETECTED Final    Cryptosporidium NOT DETECTED NOT DETECTED Final   Cyclospora cayetanensis NOT DETECTED NOT DETECTED Final   Entamoeba histolytica NOT DETECTED NOT DETECTED Final   Giardia lamblia NOT DETECTED NOT DETECTED Final   Adenovirus F40/41 NOT DETECTED NOT DETECTED Final   Astrovirus NOT DETECTED NOT DETECTED Final   Norovirus GI/GII NOT DETECTED NOT DETECTED Final   Rotavirus A NOT DETECTED NOT DETECTED Final   Sapovirus (I, II, IV, and V) NOT DETECTED NOT DETECTED Final    Comment: Performed at [facility], [address] [place], [place] [address]    Lab Basic Metabolic Panel: Recent Labs  Lab 03/21/20 0500 03/22/20 0546 03/22/20 0648 03/23/20 0556 03/24/20 0639  NA 131* QUESTIONABLE RESULTS, RECOMMEND RECOLLECT TO VERIFY 133* 139 140  K 4.5 QUESTIONABLE RESULTS, RECOMMEND RECOLLECT TO VERIFY 4.2 3.6 3.4*  CL 109 QUESTIONABLE RESULTS, RECOMMEND RECOLLECT TO VERIFY 111 113* 112*  CO2 13* QUESTIONABLE RESULTS, RECOMMEND RECOLLECT TO VERIFY 15* 16* 20*  GLUCOSE 251* QUESTIONABLE RESULTS, RECOMMEND RECOLLECT TO VERIFY 117* 182* 50*  BUN 9 QUESTIONABLE RESULTS, RECOMMEND RECOLLECT TO VERIFY CREATININE 0.79 QUESTIONABLE RESULTS, RECOMMEND RECOLLECT TO VERIFY 0.65 0.57* 0.59*  CALCIUM 7.1* QUESTIONABLE RESULTS, RECOMMEND RECOLLECT TO VERIFY 6.9* 7.4* 7.5*  MG 1.9 1.7 2.0 1.8 1.7  PHOS 1.8* 1.9* 2.1* 2.5 2.4*   Liver Function Tests: Recent Labs  Lab 03/19/20 0500 03/20/20 0604 03/21/20 0500 03/23/20 0556  AST 157* 158* 121* 87*  ALT 62* 60* 52* 37  ALKPHOS 145* 150* 156* 142*  BILITOT 2.0* 2.2* 2.6* 2.3*  PROT 5.6* 5.7* 6.1* 5.9*  ALBUMIN 1.6* 1.6* 1.8* 1.8*   No results for input(s): LIPASE, AMYLASE in the last 168 hours. No results for input(s): AMMONIA in the last 168 hours. CBC: Recent Labs  Lab 03/20/20 1920 03/21/20 0500 03/22/20 0546 03/23/20 0556 03/24/20 0639  WBC 13.9* 16.2* 12.1* 13.6* 15.6*  NEUTROABS  --  13.5* 9.3* 11.5*  --   HGB 9.3*  9.6* 7.6* 8.2* 8.5*  HCT 27.7* 28.5* 23.1* 24.9* 25.8*  MCV 102.6* 103.6* 105.5* 106.9* 106.2*  PLT 109* 123* 128* 177 183   Cardiac Enzymes: No results for input(s): CKTOTAL, CKMB, CKMBINDEX, TROPONINI in the last 168 hours. Sepsis Labs: Recent Labs  Lab 03/19/20 0500 03/19/20 0500 03/20/20 0604 03/20/20 1920  03/21/20 0500 03/21/20 0915 03/22/20 0546 03/23/20 0556 03/24/20 0639  PROCALCITON 0.74  --  1.07  --   --   --   --   --   --   WBC 11.5*   < > 12.8*   < > 16.2*  --  12.1* 13.6* 15.6*  LATICACIDVEN 2.8*  --  1.9  --   --  2.1* 1.3  --   --    < > = values in this interval not displayed.    Procedures/Operations     Alfonza Toft 03/22/2020, 3:21 PM

## 2020-04-17 NOTE — Progress Notes (Signed)
Linton Flemings, NP to sign death certificate.

## 2020-04-17 DEATH — deceased

## 2022-07-04 IMAGING — CT CT ABD-PELV W/ CM
2 of 4 series · 13 of 36 positions shown, 16 images · IV contrast (omnipaque)
Comparison: 06/26/2017

CLINICAL DATA: Heme-positive stools

EXAM:
CT CHEST, ABDOMEN, AND PELVIS WITH CONTRAST
TECHNIQUE: Multidetector CT imaging of the chest, abdomen and pelvis was
performed following the standard protocol during bolus
administration of intravenous contrast.
CONTRAST:  100mL OMNIPAQUE IOHEXOL 300 MG/ML  SOLN

[Series 1: cap with · axial · 0.98mm/px · z∈[+772,+1362]mm · 10 of 140 slices shown, 13 images]
[im 11/140  mediastinal]
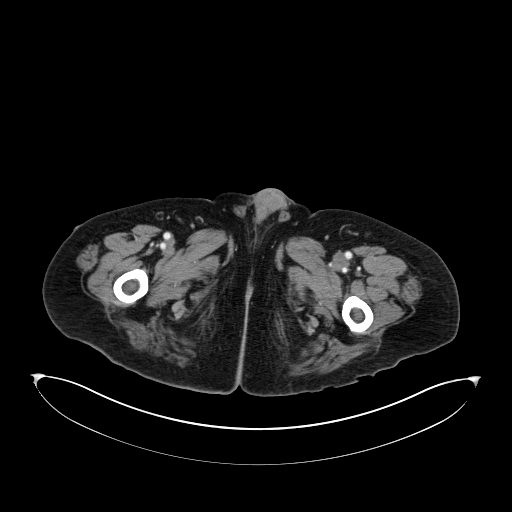
[im 11/140  lung]
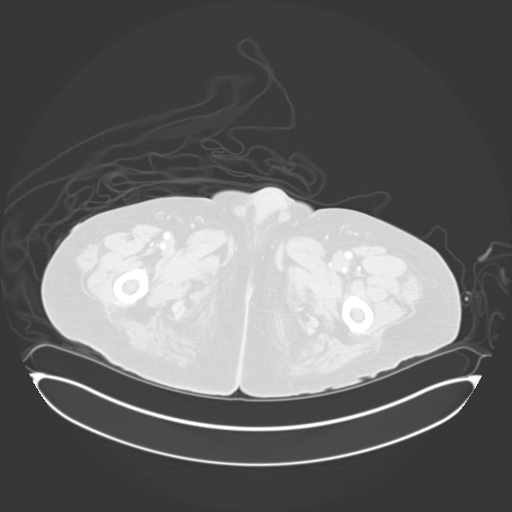
[im 22/140  lung]
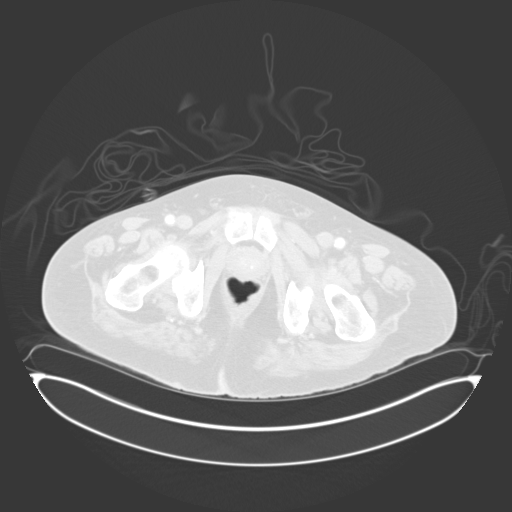
[im 43/140  lung]
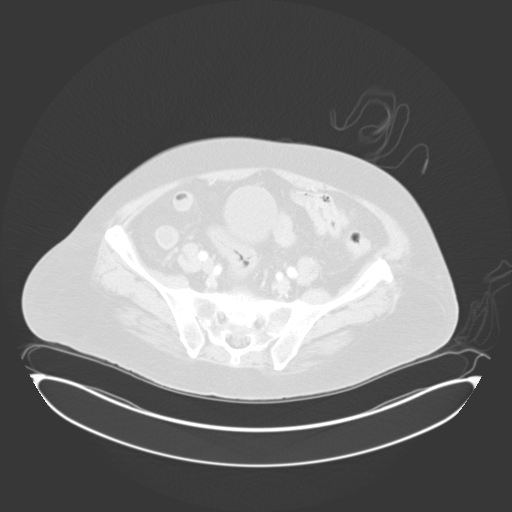
[im 54/140  lung]
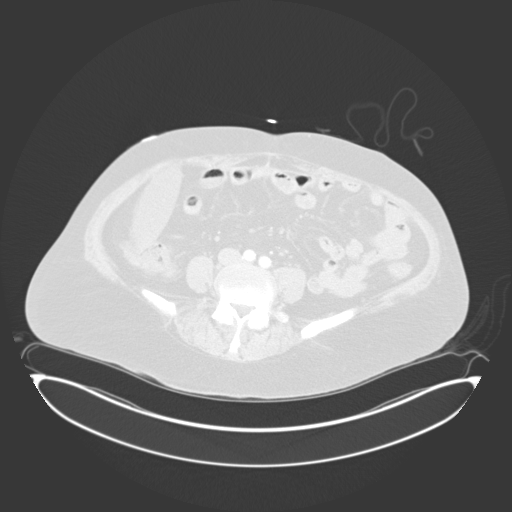
[im 65/140  mediastinal]
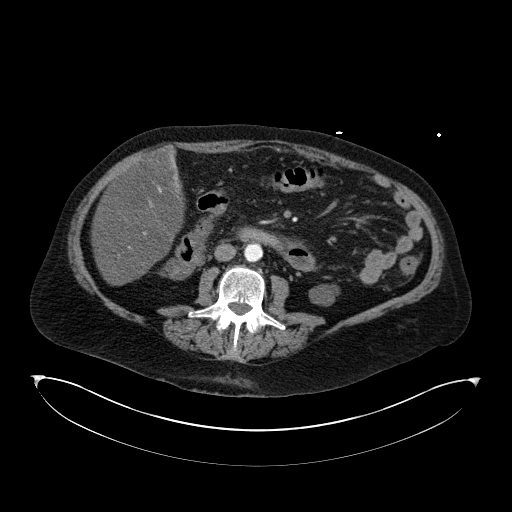
[im 65/140  lung]
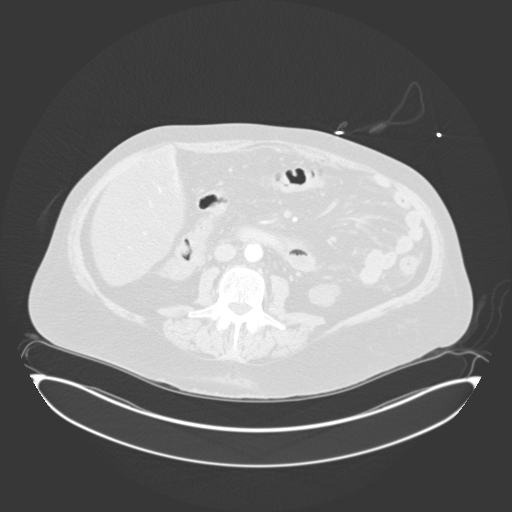
[im 75/140  lung]
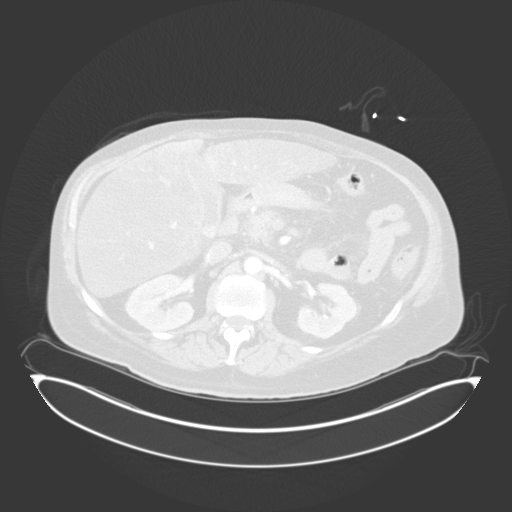
[im 86/140  lung]
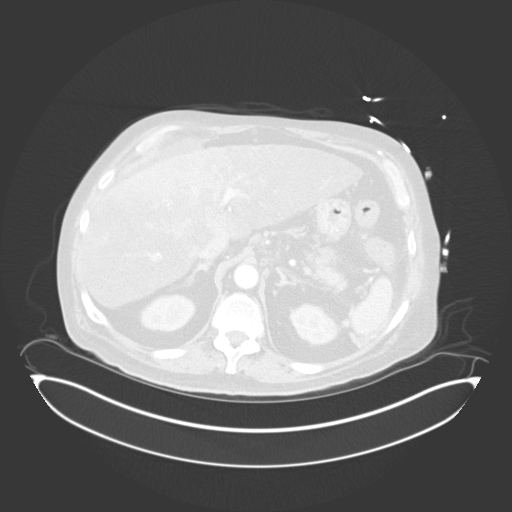
[im 107/140  lung]
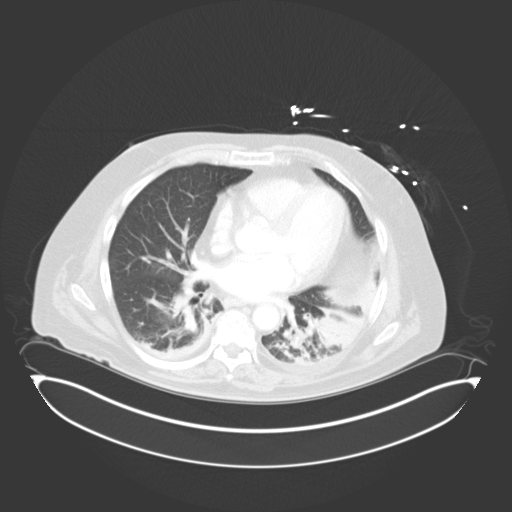
[im 118/140  mediastinal]
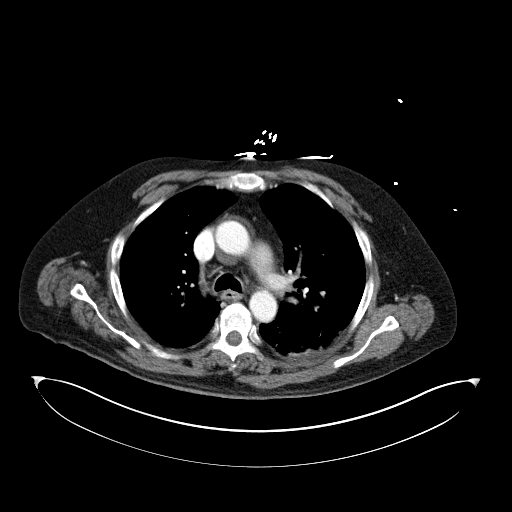
[im 118/140  lung]
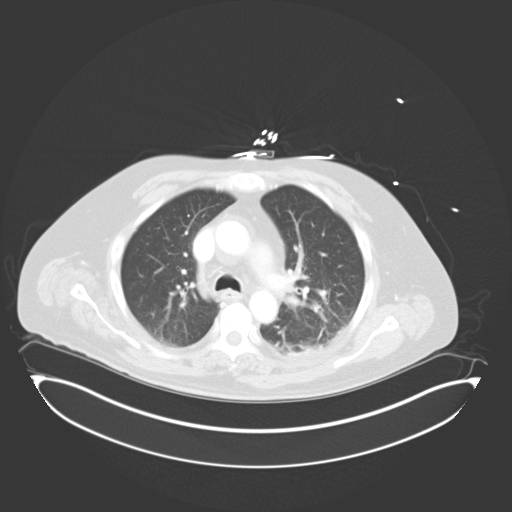
[im 129/140  lung]
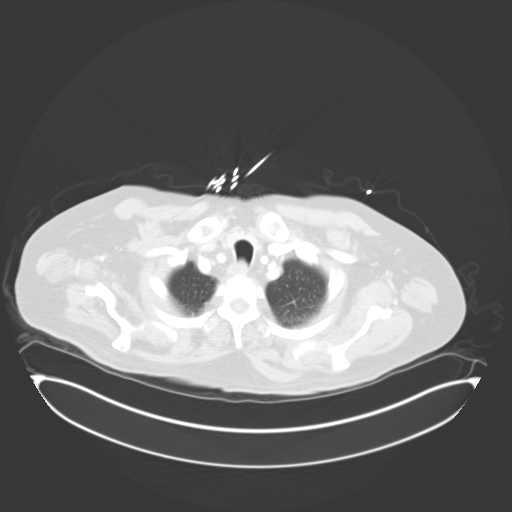

[Series 5: coronals · coronal · 0.92mm/px · 3 of 157 slices shown]
[im 32/157  lung]
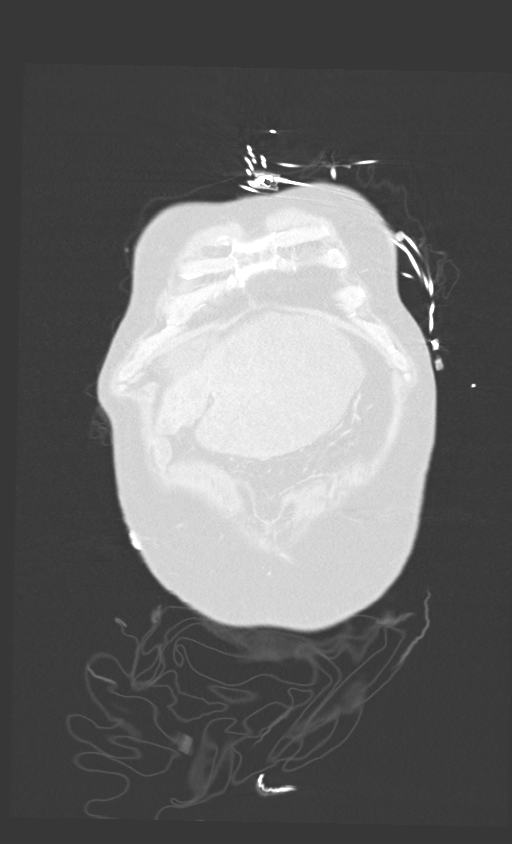
[im 63/157  lung]
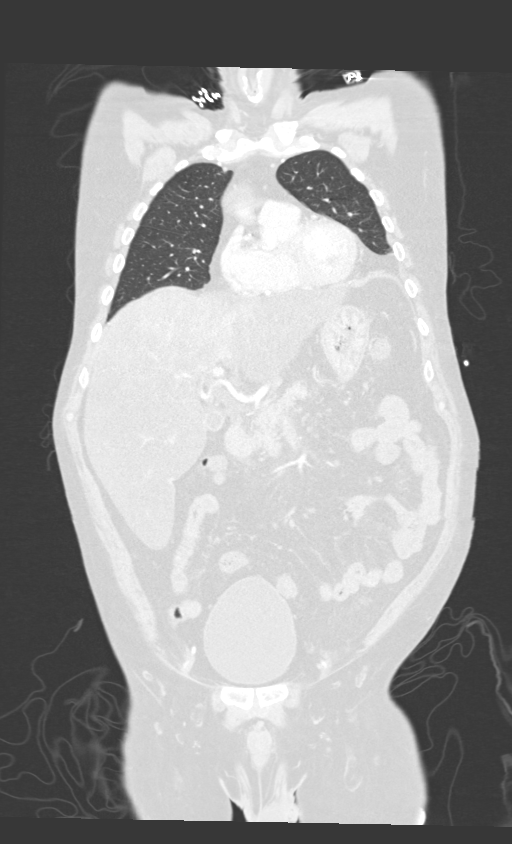
[im 94/157  lung]
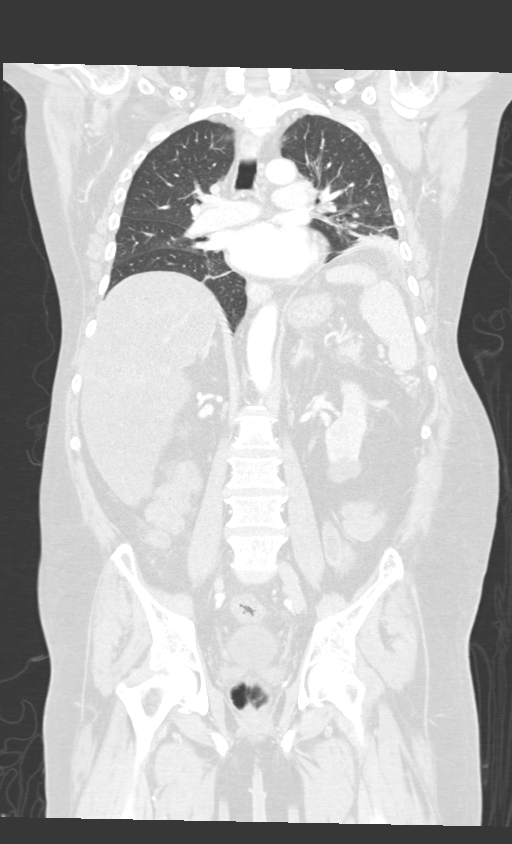

[13 of 36 positions shown; findings below may reference images not displayed]

FINDINGS: CT CHEST FINDINGS

Cardiovascular: Thoracic aorta demonstrates a normal branching
pattern. No aneurysmal dilatation or dissection is noted. No cardiac
enlargement is seen. The pulmonary artery as visualized is within
normal limits.

Mediastinum/Nodes: Thoracic inlet is unremarkable. No sizable hilar
or mediastinal adenopathy is noted. The esophagus is within normal
limits.

Lungs/Pleura: Bibasilar consolidation is noted consistent with
multifocal pneumonia. No sizable effusion is noted. No pneumothorax
is seen.

Musculoskeletal: Degenerative changes of the thoracic spine are
noted. Old rib fractures are noted with healing.

CT ABDOMEN PELVIS FINDINGS

Hepatobiliary: Diffuse fatty infiltration of the liver is noted with
the exception of some fatty sparing centrally. Gallbladder is well
distended with small dependent gallstones. No complicating factors
are seen.

Pancreas: Unremarkable. No pancreatic ductal dilatation or
surrounding inflammatory changes.

Spleen: Normal in size without focal abnormality.

Adrenals/Urinary Tract: Adrenal glands demonstrate a small nodule
within the right adrenal gland consistent with small adenoma. This
is stable from the prior exam. Kidneys show bilateral nonobstructing
renal calculi. Normal enhancement is seen. No ureteral stones are
noted. The bladder is within normal limits.

Stomach/Bowel: There is again noted some wall thickening within the
rectum extending towards the rectosigmoid with mild pericolonic
inflammatory change consistent with focal colitis. The more proximal
colon appears within normal limits. The appendix is within normal
limits. No small bowel or gastric abnormality is noted.

Vascular/Lymphatic: Aortic atherosclerosis. No enlarged abdominal or
pelvic lymph nodes.

Reproductive: Prostate is unremarkable.

Other: No abdominal wall hernia or abnormality. No abdominopelvic
ascites.

Musculoskeletal: Degenerative changes of lumbar spine are noted. No
acute bony abnormality is seen.
IMPRESSION: Bibasilar consolidation consistent with multifocal pneumonia.

Wall thickening in the rectosigmoid in a circumferential fashion
with surrounding inflammatory change consistent with focal colitis.
This is similar to that seen on prior exam.

Chronic changes as described above.

## 2022-07-07 IMAGING — CT CT ABD-PELV W/ CM
2 of 5 series · 16 of 46 positions shown, 18 images · IV contrast (omnipaque)
Comparison: 03/16/2020

CLINICAL DATA: Sepsis, lower abdominal pain, increasing lactate,
evaluate for worsening colitis

EXAM:
CT ABDOMEN AND PELVIS WITH CONTRAST
TECHNIQUE: Multidetector CT imaging of the abdomen and pelvis was performed
using the standard protocol following bolus administration of
intravenous contrast.
CONTRAST:  100mL OMNIPAQUE IOHEXOL 300 MG/ML  SOLN

[Series 2: axial st · axial · 0.94mm/px · z∈[+965,+1390]mm · 13 of 101 slices shown, 15 images]
[im 8/101  soft-tissue]
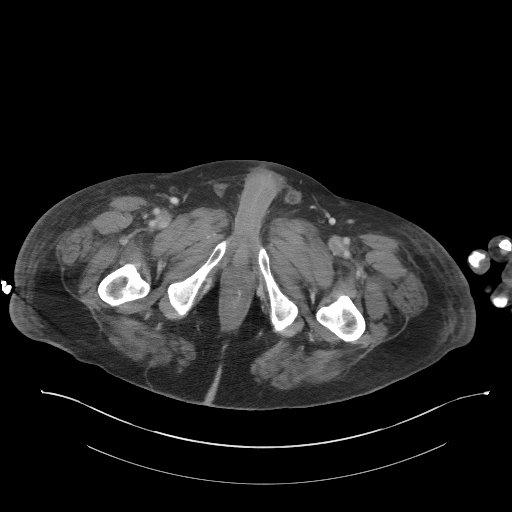
[im 8/101  bone]
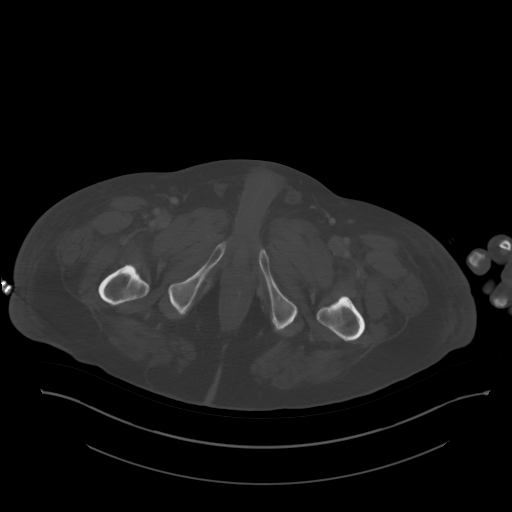
[im 15/101  soft-tissue]
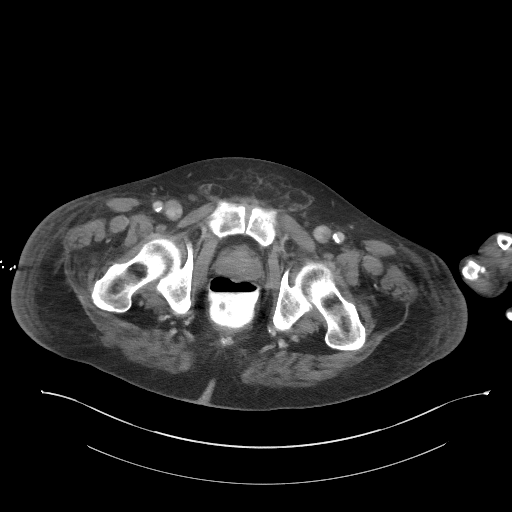
[im 22/101  soft-tissue]
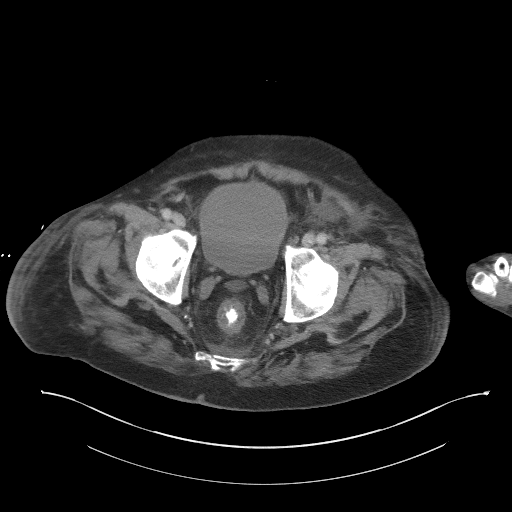
[im 29/101  soft-tissue]
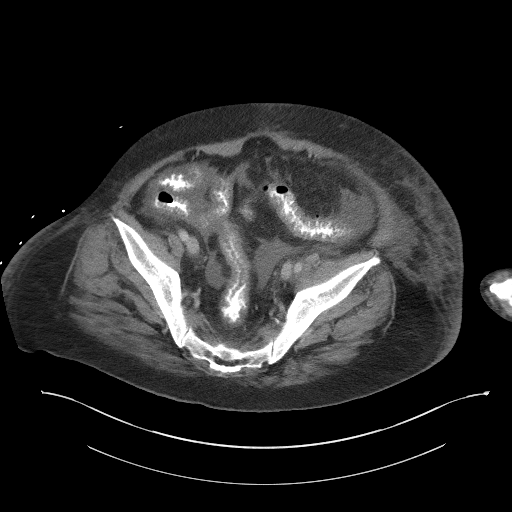
[im 36/101  soft-tissue]
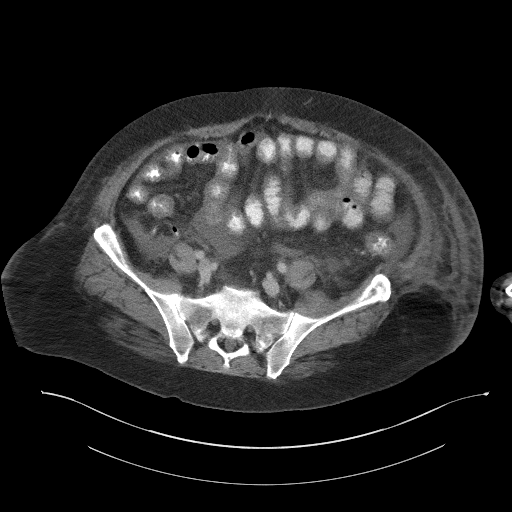
[im 43/101  soft-tissue]
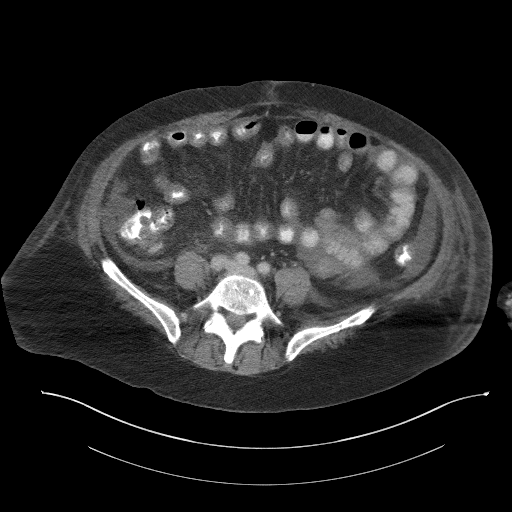
[im 51/101  soft-tissue]
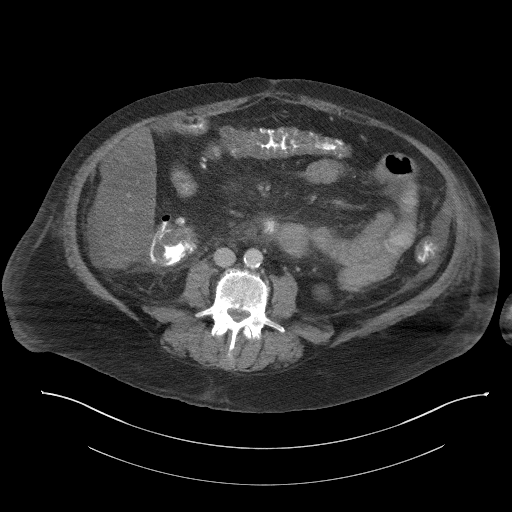
[im 58/101  soft-tissue]
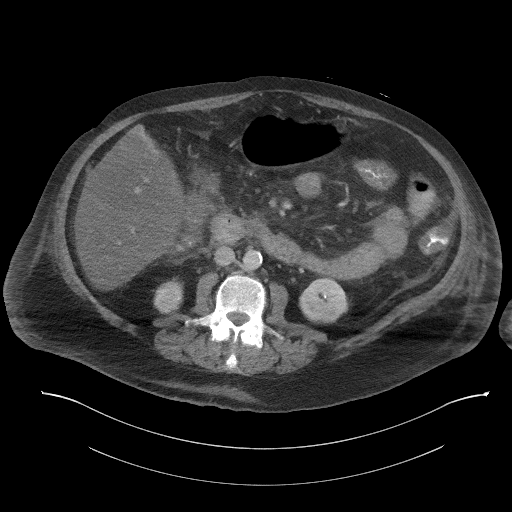
[im 65/101  soft-tissue]
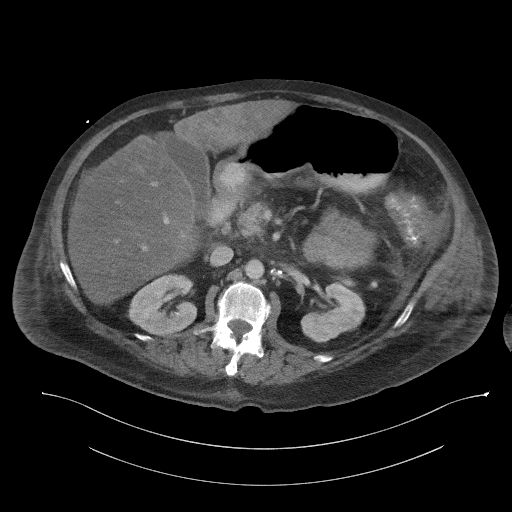
[im 65/101  bone]
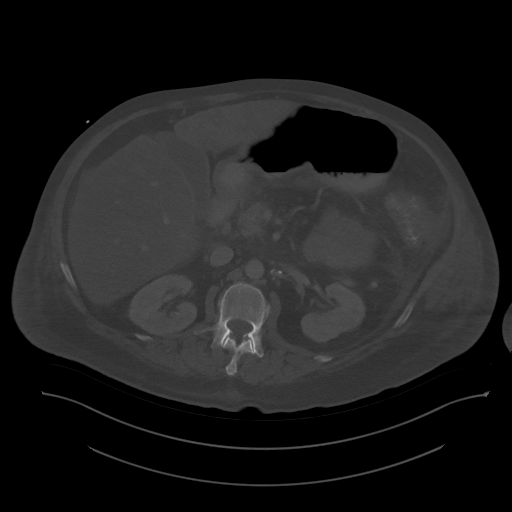
[im 72/101  soft-tissue]
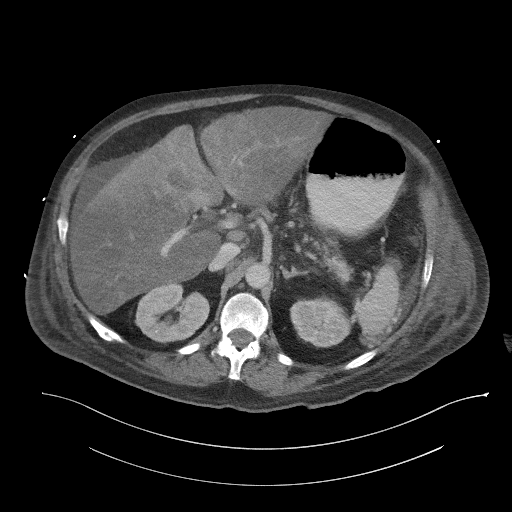
[im 79/101  soft-tissue]
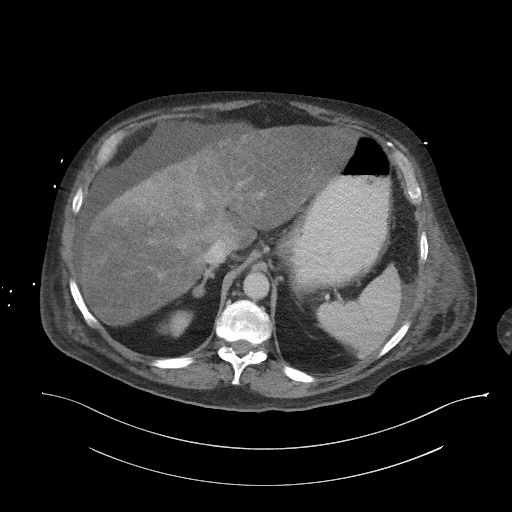
[im 86/101  soft-tissue]
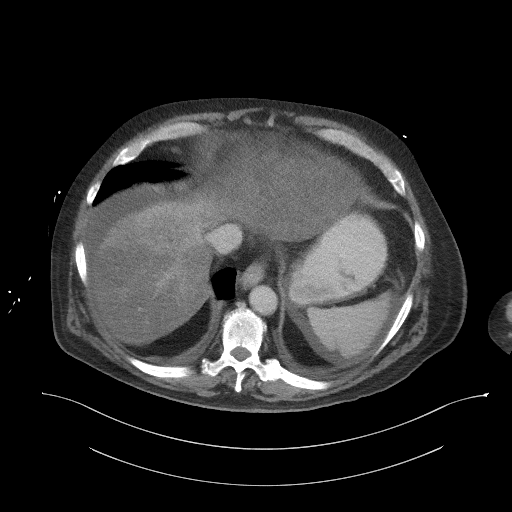
[im 93/101  soft-tissue]
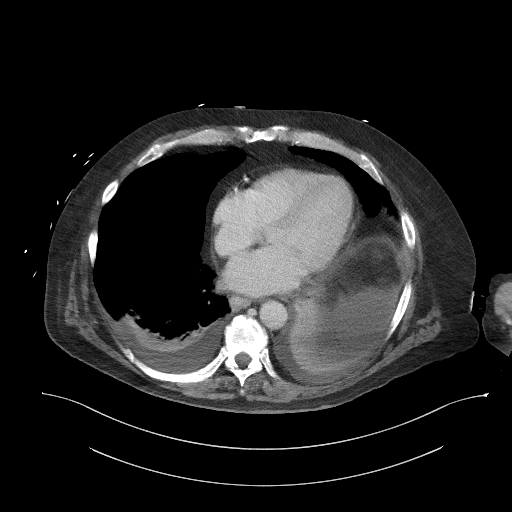

[Series 5: coronal st · coronal · 0.89mm/px · 3 of 101 slices shown]
[im 34/101  soft-tissue]
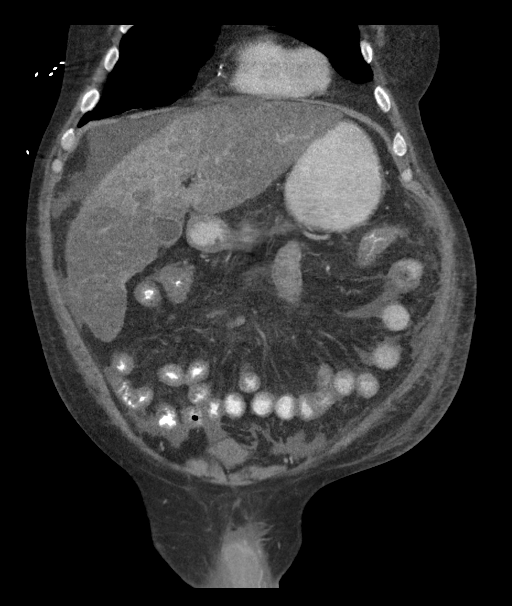
[im 45/101  soft-tissue]
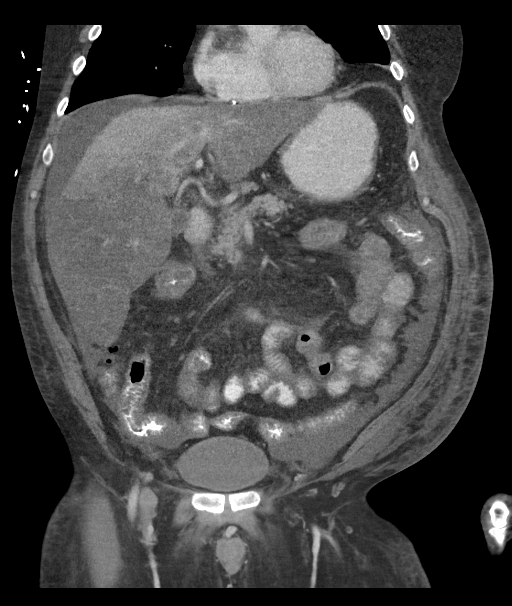
[im 56/101  soft-tissue]
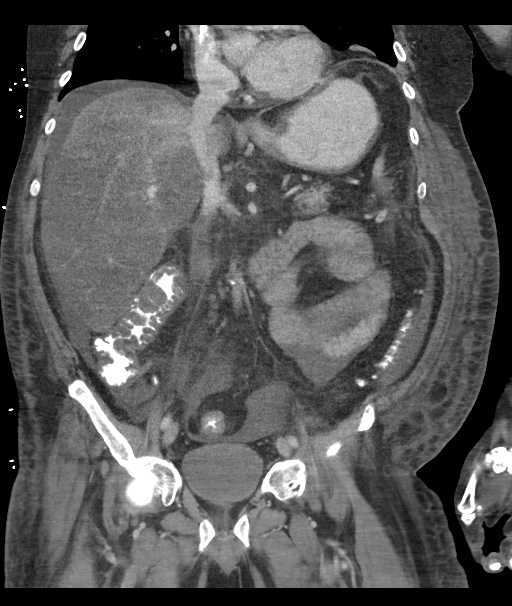

[16 of 46 positions shown; findings below may reference images not displayed]

FINDINGS: Lower chest: Small right and small to moderate left pleural
effusions, increased. Associated lower lobe opacities, likely
atelectasis.

Hepatobiliary: Geographic hepatic steatosis with areas of focal
fatty sparing.

Layering small gallstones (series 2/image 35), without associated
inflammatory changes. No intrahepatic or extrahepatic ductal
dilatation.

Pancreas: Within normal limits.

Spleen: Within normal limits.

Adrenals/Urinary Tract: Stable 14 mm right adrenal nodule, unchanged
from 7707, likely reflecting a benign adrenal adenoma. Left adrenal
gland is within normal limits.

Numerous bilateral nonobstructing renal calculi, measuring up to 6
mm in the left upper kidney (series 2/image 31). 11 mm right lower
pole renal cyst (series 2/image 38). 3.5 cm bilobed cyst in the left
lower kidney (series 2/image 49), benign (Bosniak II). No
hydronephrosis.

Bladder is within normal limits.

Stomach/Bowel: Stomach is within normal limits.

No evidence of bowel obstruction.

Normal appendix (series 2/image 66).

Long segment wall thickening involving the colon, particularly
involving the hepatic flexure on the current CT (series 2/image 46),
reflecting infectious/inflammatory colitis. As appearance is
progressive from the prior, which was largely isolated to the
rectosigmoid colon.

No pneumatosis. No drainable fluid collection/abscess. No free air.

Vascular/Lymphatic: No evidence of abdominal aortic aneurysm.

Atherosclerotic calcifications of the abdominal aorta and branch
vessels.

No suspicious abdominopelvic lymphadenopathy.

Reproductive: Prostate is unremarkable.

Other: Small to moderate abdominopelvic ascites, new.

Musculoskeletal: Visualized osseous structures are within normal
limits.
IMPRESSION: Progressive infectious/inflammatory colitis, now particularly
involving the hepatic flexure. No pneumatosis. No drainable fluid
collection/abscess. No free air.

Small to moderate abdominopelvic ascites, new.

Small right and small to moderate left pleural effusions, increased.
Associated lower lobe opacities, likely atelectasis.

Additional stable ancillary findings as above.

## 2022-07-08 IMAGING — DX DG CHEST 1V
1 series · 1 of 1 positions shown · non-contrast
Comparison: Single-view of the chest 03/15/2020.

CLINICAL DATA: Patient admitted 03/15/2020 and DKA with lactic
acidosis presumably due to pneumonia.

EXAM:
CHEST  1 VIEW

[chest ap]
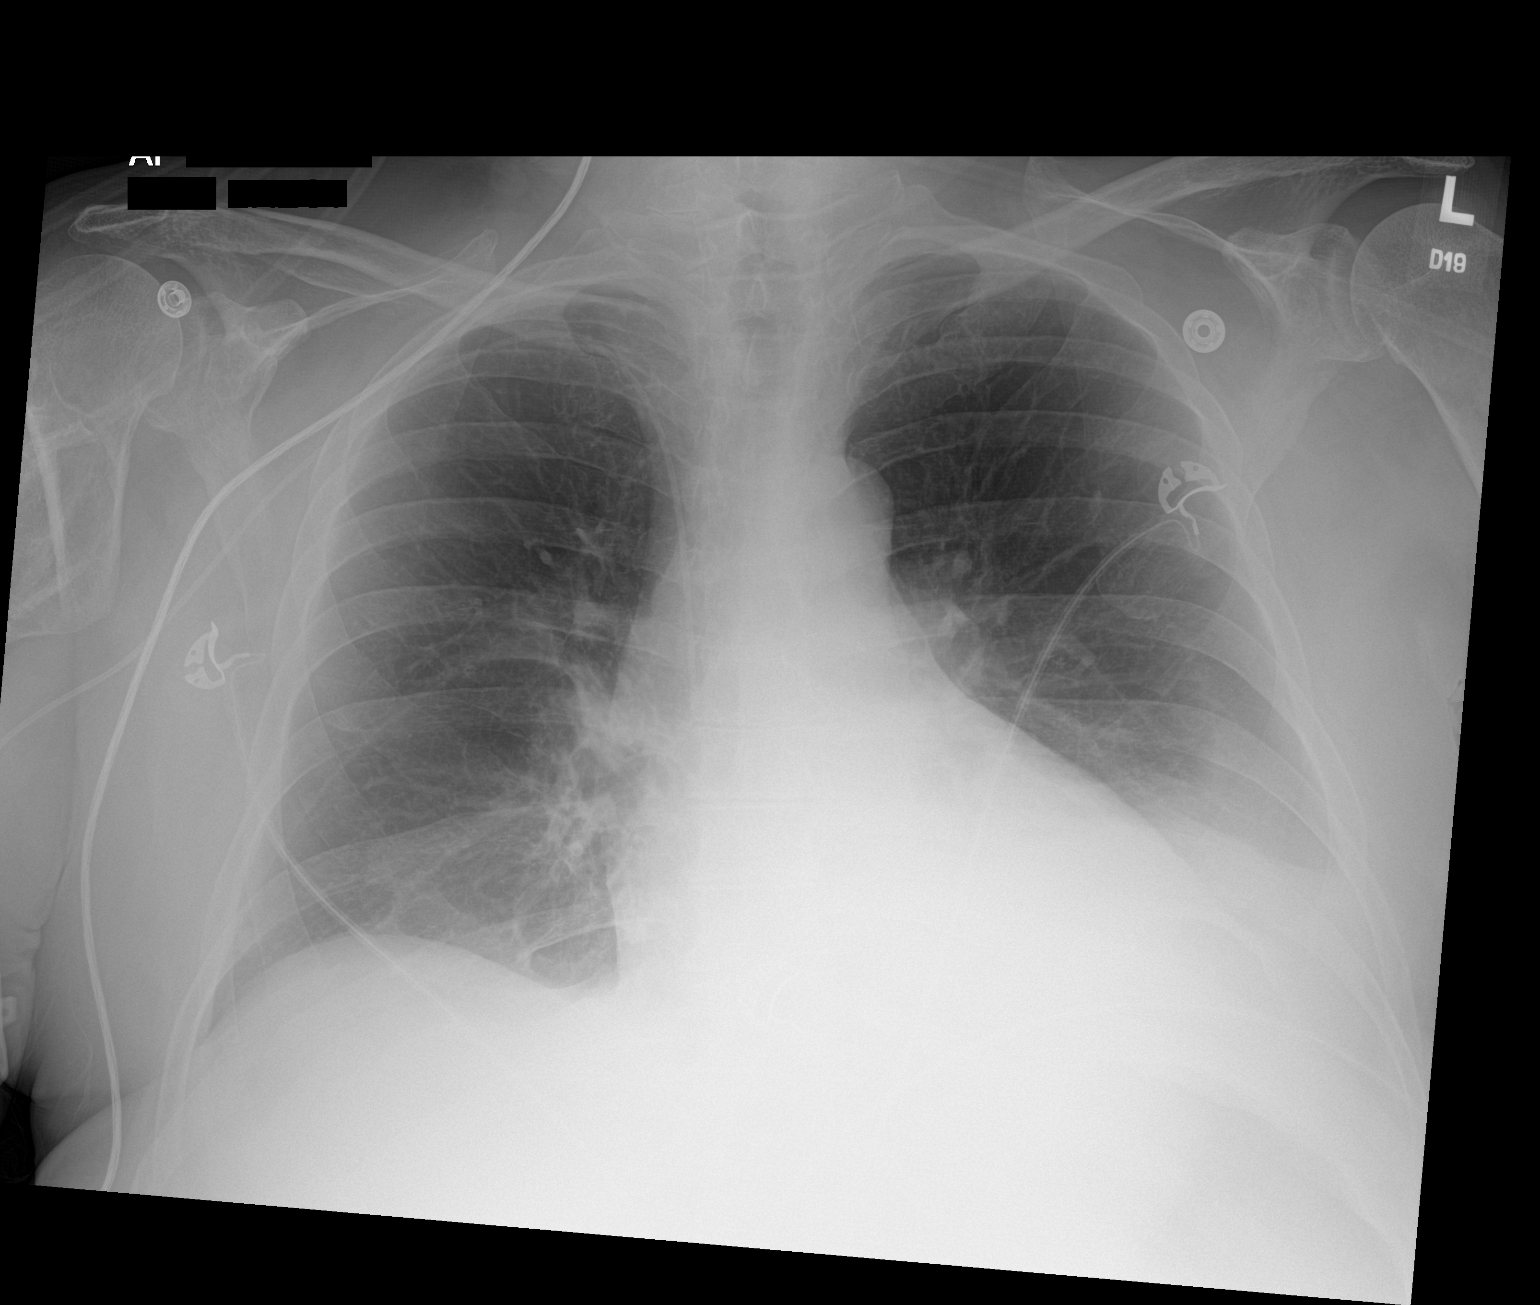

[1 of 1 positions shown; findings below may reference images not displayed]

FINDINGS: The patient has a new small to moderate left pleural effusion and
basilar airspace disease. The right lung is clear. No pneumothorax.
Heart size is normal. Tip of right PICC projects in the lower
superior vena cava.
IMPRESSION: New small to moderate left pleural effusion and basilar airspace
disease which could be due to atelectasis or pneumonia.

Right PICC projects in good position.
